# Patient Record
Sex: Male | Born: 1955 | Race: White | Hispanic: No | Smoking: Former smoker
Health system: Southern US, Community
[De-identification: ages and names within clinical notes are randomized; demographics above are authoritative.]

## PROBLEM LIST (undated history)

## (undated) DIAGNOSIS — E785 Hyperlipidemia, unspecified: Secondary | ICD-10-CM

## (undated) DIAGNOSIS — B019 Varicella without complication: Secondary | ICD-10-CM

## (undated) DIAGNOSIS — I10 Essential (primary) hypertension: Secondary | ICD-10-CM

## (undated) HISTORY — DX: Hyperlipidemia, unspecified: E78.5

## (undated) HISTORY — DX: Essential (primary) hypertension: I10

## (undated) HISTORY — DX: Varicella without complication: B01.9

---
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
## 2015-09-29 ENCOUNTER — Encounter: Payer: Self-pay | Admitting: Family Medicine

## 2015-09-29 ENCOUNTER — Ambulatory Visit (INDEPENDENT_AMBULATORY_CARE_PROVIDER_SITE_OTHER): Payer: 59 | Admitting: Family Medicine

## 2015-09-29 VITALS — BP 144/82 | HR 79 | Temp 98.2°F | Ht 67.5 in | Wt 177.8 lb

## 2015-09-29 DIAGNOSIS — Z125 Encounter for screening for malignant neoplasm of prostate: Secondary | ICD-10-CM | POA: Diagnosis not present

## 2015-09-29 DIAGNOSIS — E785 Hyperlipidemia, unspecified: Secondary | ICD-10-CM | POA: Diagnosis not present

## 2015-09-29 DIAGNOSIS — R3989 Other symptoms and signs involving the genitourinary system: Secondary | ICD-10-CM | POA: Insufficient documentation

## 2015-09-29 DIAGNOSIS — I10 Essential (primary) hypertension: Secondary | ICD-10-CM | POA: Insufficient documentation

## 2015-09-29 NOTE — Patient Instructions (Signed)
Nice to meet you. We will have you return for lab work. Please monitor your urine dripping and if it worsens please let us know.

## 2015-09-29 NOTE — Assessment & Plan Note (Signed)
Intermittently takes simvastatin. We will have the patient return for fasting lipid panel.

## 2015-09-29 NOTE — Progress Notes (Signed)
Patient ID: William Yang, male   DOB: 04/01/1956, 60 y.o.   MRN: [id number]  Tommi Rumps, MD Phone: [phone]  William Yang is a 60 y.o. male who presents today for new patient visit.  HYPERTENSION Disease Monitoring Home BP Monitoring no Chest pain- no    Dyspnea- no Medications Compliance-  taking HCTZ 25 mg daily. Lightheadedness-  no  Edema- no  HYPERLIPIDEMIA Symptoms Chest pain on exertion:  No   Leg claudication:   No Medications: Compliance- intermittently takes statin Right upper quadrant pain- no  Muscle aches- no  Notes that infrequently he leaks a small amount of urine after urinating. He notes he shakes his penis and tries to drain his bladder though infrequently he will have a small amount of urine that still comes out. Does not occur every time he urinates. Does not leak urine at other times. No urgency, frequency, dysuria, or flow issues. Notes he urinates 7-8 times per day. None at night. PSAs a been normal in the past. Typically only occurs when he is rushing to go to the bathroom. Has been on Flomax before for this. Is not helpful.    Active Ambulatory Problems    Diagnosis Date Noted  . Essential hypertension 09/29/2015  . Hyperlipidemia 09/29/2015  . Urine troubles 09/29/2015   Resolved Ambulatory Problems    Diagnosis Date Noted  . No Resolved Ambulatory Problems   Past Medical History  Diagnosis Date  . Chickenpox   . Hypertension     Family History  Problem Relation Age of Onset  . Alcoholism      Parent  . Lung cancer Brother   . Hypertension      Parent    Social History   Social History  . Marital Status: Married    Spouse Name: N/A  . Number of Children: N/A  . Years of Education: N/A   Occupational History  . Not on file.   Social History Main Topics  . Smoking status: Current Every Day Smoker  . Smokeless tobacco: Not on file  . Alcohol Use: 12.6 oz/week    21 Standard drinks or equivalent per week     Comment: 21 beers a  week   . Drug Use: No  . Sexual Activity: Not on file   Other Topics Concern  . Not on file   Social History Narrative  . No narrative on file    ROS   General:  Negative for nexplained weight loss, fever Skin: Negative for new or changing mole, sore that won't heal HEENT: Negative for trouble hearing, trouble seeing, ringing in ears, mouth sores, hoarseness, change in voice, dysphagia. CV:  Negative for chest pain, dyspnea, edema, palpitations Resp: Positive for cough (recent URI that is resolving), Negative for dyspnea, hemoptysis GI: Negative for nausea, vomiting, diarrhea, constipation, abdominal pain, melena, hematochezia. GU: Negative for dysuria, incontinence, urinary hesitance, hematuria, vaginal or penile discharge, polyuria, sexual difficulty, lumps in testicle or breasts MSK: Negative for muscle cramps or aches, joint pain or swelling Neuro: Negative for headaches, weakness, numbness, dizziness, passing out/fainting Psych: Negative for depression, anxiety, memory problems  Objective  Physical Exam Filed Vitals:   09/29/15 1527 09/29/15 2127  BP: 148/88 144/82  Pulse: 79   Temp: 98.2 F (36.8 C)     BP Readings from Last 3 Encounters:  09/29/15 144/82   Wt Readings from Last 3 Encounters:  09/29/15 177 lb 12.8 oz (80.65 kg)    Physical Exam  Constitutional: He is well-developed, well-nourished,  and in no distress.  HENT:  Head: Normocephalic and atraumatic.  Right Ear: External ear normal.  Left Ear: External ear normal.  Mouth/Throat: Oropharynx is clear and moist. No oropharyngeal exudate.  Eyes: Conjunctivae are normal. Pupils are equal, round, and reactive to light.  Neck: Neck supple.  Cardiovascular: Normal rate, regular rhythm and normal heart sounds.  Exam reveals no gallop and no friction rub.   No murmur heard. Pulmonary/Chest: Effort normal and breath sounds normal. No respiratory distress. He has no wheezes. He has no rales.  Abdominal:  Soft. Bowel sounds are normal. He exhibits no distension. There is no tenderness. There is no rebound and no guarding.  Genitourinary: Penis normal.  Normal testicles, normal scrotum, normal epididymis, normal vas deferens, no inguinal hernias  Musculoskeletal: He exhibits no edema.  Lymphadenopathy:    He has no cervical adenopathy.  Neurological: He is alert. Gait normal.  Skin: Skin is warm and dry. He is not diaphoretic.  Psychiatric: Mood and affect normal.     Assessment/Plan:   Essential hypertension Not at goal. Defined patient's goal as less than 140/90. Advised addition of medication and/or diet and exercise, though patient declined both of these options stating that this is what his blood pressure has been for a long time and he would prefer to continue to monitor. I additionally discussed the risks of having undertreated high blood pressure. We'll check lab work.  Hyperlipidemia Intermittently takes simvastatin. We will have the patient return for fasting lipid panel.  Urine troubles Patient leaks very small amounts of urine infrequently after urination only. Benign exam of genitals. Patient refused rectal exam for prostate exam. Given infrequency the was advised to try to completely empty his bladder and take his time when he goes to the bathroom to see if this helps. If worsens he will let us know. Given return precautions.    Orders Placed This Encounter  Procedures  . Comp Met (CMET)    Standing Status: Future     Number of Occurrences:      Standing Expiration Date: 09/28/2016  . Lipid Profile    Standing Status: Future     Number of Occurrences:      Standing Expiration Date: 09/28/2016  . PSA    Standing Status: Future     Number of Occurrences:      Standing Expiration Date: 09/28/2016  . HgB A1c    Standing Status: Future     Number of Occurrences:      Standing Expiration Date: 09/28/2016    Tommi Rumps

## 2015-09-29 NOTE — Assessment & Plan Note (Addendum)
Not at goal. Defined patient's goal as less than 140/90. Advised addition of medication and/or diet and exercise, though patient declined both of these options stating that this is what his blood pressure has been for a long time and he would prefer to continue to monitor. I additionally discussed the risks of having undertreated high blood pressure. We'll check lab work.

## 2015-09-29 NOTE — Progress Notes (Signed)
Pre visit review using our clinic review tool, if applicable. No additional management support is needed unless otherwise documented below in the visit note. 

## 2015-09-29 NOTE — Assessment & Plan Note (Signed)
Patient leaks very small amounts of urine infrequently after urination only. Benign exam of genitals. Patient refused rectal exam for prostate exam. Given infrequency the was advised to try to completely empty his bladder and take his time when he goes to the bathroom to see if this helps. If worsens he will let us know. Given return precautions.

## 2015-10-31 ENCOUNTER — Ambulatory Visit: Payer: 59 | Admitting: Family Medicine

## 2015-10-31 ENCOUNTER — Other Ambulatory Visit (INDEPENDENT_AMBULATORY_CARE_PROVIDER_SITE_OTHER): Payer: Managed Care, Other (non HMO)

## 2015-10-31 DIAGNOSIS — Z125 Encounter for screening for malignant neoplasm of prostate: Secondary | ICD-10-CM

## 2015-10-31 DIAGNOSIS — I10 Essential (primary) hypertension: Secondary | ICD-10-CM

## 2015-10-31 DIAGNOSIS — E785 Hyperlipidemia, unspecified: Secondary | ICD-10-CM | POA: Diagnosis not present

## 2015-10-31 LAB — COMPREHENSIVE METABOLIC PANEL
ALBUMIN: 4.3 g/dL (ref 3.5–5.2)
ALK PHOS: 63 U/L (ref 39–117)
ALT: 15 U/L (ref 0–53)
AST: 14 U/L (ref 0–37)
BILIRUBIN TOTAL: 0.7 mg/dL (ref 0.2–1.2)
BUN: 13 mg/dL (ref 6–23)
CALCIUM: 9.1 mg/dL (ref 8.4–10.5)
CO2: 26 mEq/L (ref 19–32)
Chloride: 103 mEq/L (ref 96–112)
Creatinine, Ser: 0.84 mg/dL (ref 0.40–1.50)
GFR: 98.89 mL/min (ref >60.00)
Glucose, Bld: 104 mg/dL — ABNORMAL HIGH (ref 70–99)
POTASSIUM: 3.9 meq/L (ref 3.5–5.1)
Sodium: 138 mEq/L (ref 135–145)
TOTAL PROTEIN: 6.4 g/dL (ref 6.0–8.3)

## 2015-10-31 LAB — LIPID PANEL
CHOLESTEROL: 173 mg/dL (ref 0–200)
HDL: 48.4 mg/dL (ref >39.00)
LDL Cholesterol: 107 mg/dL — ABNORMAL HIGH (ref 0–99)
NONHDL: 124.5
TRIGLYCERIDES: 86 mg/dL (ref 0.0–149.0)
Total CHOL/HDL Ratio: 4
VLDL: 17.2 mg/dL (ref 0.0–40.0)

## 2015-10-31 LAB — HEMOGLOBIN A1C: Hgb A1c MFr Bld: 6.1 % (ref 4.6–6.5)

## 2015-10-31 LAB — PSA: PSA: 2.55 ng/mL (ref 0.10–4.00)

## 2015-11-04 ENCOUNTER — Telehealth: Payer: Self-pay | Admitting: *Deleted

## 2015-11-04 ENCOUNTER — Encounter (INDEPENDENT_AMBULATORY_CARE_PROVIDER_SITE_OTHER): Payer: Self-pay

## 2015-11-04 ENCOUNTER — Other Ambulatory Visit: Payer: Self-pay | Admitting: Family Medicine

## 2015-11-04 MED ORDER — SIMVASTATIN 40 MG PO TABS
40.0000 mg | ORAL_TABLET | Freq: Every day | ORAL | Status: DC
Start: 2015-11-04 — End: 2016-01-31

## 2015-11-04 NOTE — Telephone Encounter (Signed)
Patient has requested a call in return to discuss the change in Simvastatin dose. Patients wife contact [phone]

## 2015-11-04 NOTE — Telephone Encounter (Signed)
I have spoke to the patients wife about message.

## 2015-11-04 NOTE — Telephone Encounter (Signed)
It is in the lab results that you sent him the message on this morning.

## 2015-11-04 NOTE — Telephone Encounter (Signed)
Roselyn Reef do you knw if Dr. Truett Mainland mentions anything to about his meds? Please advise, thanks

## 2015-11-07 ENCOUNTER — Telehealth: Payer: Self-pay | Admitting: Family Medicine

## 2015-11-07 NOTE — Telephone Encounter (Signed)
Please find out if the patient is a had any colon polyps. If he has not had colon polyps we can do the stool cards and he can come in to pick them up at his convenience. If he has had colon polyps we should have a discussion regarding the need for colonoscopy. If at that time he still wants to do stool cards we can do those.

## 2015-11-07 NOTE — Telephone Encounter (Signed)
Pt is overdue for Colonoscopy and wants to due the stool cards. Please advise, thanks

## 2015-11-07 NOTE — Telephone Encounter (Signed)
Pt is due for a coloscopy but wants to do the stool sample instead. Let me know when he can come in to do that? Call pt @ [phone]. Thank you!

## 2015-11-07 NOTE — Telephone Encounter (Signed)
Spoke with patients wife and she is going to pick up the stool cards for Mr Vannucci.

## 2015-11-07 NOTE — Telephone Encounter (Signed)
Pt states that he has never polyps. Please advise, thanks

## 2015-11-07 NOTE — Telephone Encounter (Signed)
Please place stool cards at from for patient to pick up. Please call patient to inform them he can come pick them up and complete them. Thanks.

## 2015-11-17 ENCOUNTER — Ambulatory Visit (INDEPENDENT_AMBULATORY_CARE_PROVIDER_SITE_OTHER): Payer: Managed Care, Other (non HMO)

## 2015-11-17 ENCOUNTER — Telehealth: Payer: Self-pay | Admitting: *Deleted

## 2015-11-17 ENCOUNTER — Other Ambulatory Visit (INDEPENDENT_AMBULATORY_CARE_PROVIDER_SITE_OTHER): Payer: Managed Care, Other (non HMO)

## 2015-11-17 DIAGNOSIS — Z114 Encounter for screening for human immunodeficiency virus [HIV]: Secondary | ICD-10-CM

## 2015-11-17 DIAGNOSIS — Z418 Encounter for other procedures for purposes other than remedying health state: Secondary | ICD-10-CM | POA: Diagnosis not present

## 2015-11-17 DIAGNOSIS — Z1159 Encounter for screening for other viral diseases: Secondary | ICD-10-CM

## 2015-11-17 DIAGNOSIS — Z23 Encounter for immunization: Secondary | ICD-10-CM

## 2015-11-17 DIAGNOSIS — Z299 Encounter for prophylactic measures, unspecified: Secondary | ICD-10-CM

## 2015-11-17 NOTE — Telephone Encounter (Signed)
Labs and dx?  

## 2015-11-17 NOTE — Telephone Encounter (Signed)
I do not recall advising him to come in for lab work.

## 2015-11-17 NOTE — Telephone Encounter (Signed)
What would you like me to do?

## 2015-11-17 NOTE — Progress Notes (Signed)
Dr.Sonnenberg gave verbal okay to give patient TDaP and zoster vaccine if not given in the last ten years.  Patient tolerated well.

## 2015-11-18 ENCOUNTER — Other Ambulatory Visit: Payer: Self-pay | Admitting: *Deleted

## 2015-11-18 ENCOUNTER — Other Ambulatory Visit: Payer: Managed Care, Other (non HMO)

## 2015-11-18 DIAGNOSIS — Z1159 Encounter for screening for other viral diseases: Secondary | ICD-10-CM

## 2015-11-18 DIAGNOSIS — Z114 Encounter for screening for human immunodeficiency virus [HIV]: Secondary | ICD-10-CM

## 2015-11-18 NOTE — Telephone Encounter (Signed)
Spoke with patient about doing HIV and Hep C screening. He is fine with doing both screenings. I placed orders for both test

## 2015-11-18 NOTE — Telephone Encounter (Signed)
Noted  

## 2015-11-18 NOTE — Telephone Encounter (Signed)
Do i throw samples away?

## 2015-11-19 LAB — HIV ANTIBODY (ROUTINE TESTING W REFLEX): HIV: NONREACTIVE

## 2015-11-19 LAB — HEPATITIS C ANTIBODY: HCV AB: NEGATIVE

## 2015-11-21 ENCOUNTER — Other Ambulatory Visit: Payer: Managed Care, Other (non HMO)

## 2015-11-22 ENCOUNTER — Other Ambulatory Visit: Payer: Managed Care, Other (non HMO)

## 2015-11-22 ENCOUNTER — Telehealth: Payer: Self-pay | Admitting: Family Medicine

## 2015-11-22 NOTE — Telephone Encounter (Signed)
Left message returning William Yang's call.

## 2015-11-24 ENCOUNTER — Other Ambulatory Visit (INDEPENDENT_AMBULATORY_CARE_PROVIDER_SITE_OTHER): Payer: Managed Care, Other (non HMO)

## 2015-11-24 DIAGNOSIS — Z1211 Encounter for screening for malignant neoplasm of colon: Secondary | ICD-10-CM | POA: Diagnosis not present

## 2015-11-25 ENCOUNTER — Other Ambulatory Visit: Payer: Self-pay | Admitting: *Deleted

## 2015-11-25 DIAGNOSIS — Z1211 Encounter for screening for malignant neoplasm of colon: Secondary | ICD-10-CM

## 2015-11-25 LAB — FECAL OCCULT BLOOD, IMMUNOCHEMICAL: Fecal Occult Bld: NEGATIVE

## 2015-11-26 ENCOUNTER — Encounter: Payer: Self-pay | Admitting: Family Medicine

## 2016-01-05 ENCOUNTER — Other Ambulatory Visit: Payer: Self-pay | Admitting: *Deleted

## 2016-01-05 NOTE — Telephone Encounter (Signed)
Please advise refill, this was a historical medication for you. thanks

## 2016-01-05 NOTE — Telephone Encounter (Signed)
Medication refill requested for hydrochlothiazide [facility]

## 2016-01-06 ENCOUNTER — Other Ambulatory Visit: Payer: Self-pay | Admitting: *Deleted

## 2016-01-06 MED ORDER — HYDROCHLOROTHIAZIDE 25 MG PO TABS: 25.0000 mg | ORAL_TABLET | Freq: Every day | ORAL | Status: DC

## 2016-01-06 NOTE — Telephone Encounter (Signed)
Okay to refill? 

## 2016-01-31 ENCOUNTER — Other Ambulatory Visit: Payer: Self-pay | Admitting: Family Medicine

## 2016-02-07 ENCOUNTER — Other Ambulatory Visit: Payer: Self-pay | Admitting: Family Medicine

## 2016-05-02 ENCOUNTER — Telehealth: Payer: Self-pay | Admitting: Family Medicine

## 2016-05-02 NOTE — Telephone Encounter (Signed)
Forms in red folder

## 2016-05-02 NOTE — Telephone Encounter (Signed)
Pt dropped off FMLA paperwork to be filled out.. Placed in Dr. Caryl Bis folder up front.Marland Kitchen

## 2016-05-05 DIAGNOSIS — Z7689 Persons encountering health services in other specified circumstances: Secondary | ICD-10-CM

## 2016-05-05 NOTE — Telephone Encounter (Signed)
Forms filled out for FMLA based on wifes illness. Placed on [employer].

## 2016-05-07 ENCOUNTER — Ambulatory Visit (INDEPENDENT_AMBULATORY_CARE_PROVIDER_SITE_OTHER): Payer: Managed Care, Other (non HMO) | Admitting: Family Medicine

## 2016-05-07 VITALS — BP 120/82 | HR 63 | Wt 185.0 lb

## 2016-05-07 DIAGNOSIS — E785 Hyperlipidemia, unspecified: Secondary | ICD-10-CM | POA: Diagnosis not present

## 2016-05-07 DIAGNOSIS — M25562 Pain in left knee: Secondary | ICD-10-CM

## 2016-05-07 NOTE — Assessment & Plan Note (Signed)
Continue Zocor. Check direct LDL and CMP.

## 2016-05-07 NOTE — Telephone Encounter (Signed)
Spoke with patients wife and forms put up front to pick up.

## 2016-05-07 NOTE — Assessment & Plan Note (Signed)
Located in the area of the hamstring tendon and its insertion area. Suspect strain or tendinitis. Has been improving. Encouraged icing and stretching. Ibuprofen as needed. If not improving would refer to sports medicine for ultrasound.

## 2016-05-07 NOTE — Patient Instructions (Signed)
Nice to see you. We're going to check some lab work and call you with the results. You should ice the area of discomfort in your leg as this is likely a strain or tendinitis. You can also stretch the area by stretching your hamstrings. If this does not continue to improve please let us know.

## 2016-05-07 NOTE — Progress Notes (Signed)
  Tommi Rumps, MD Phone: [phone]  William Yang is a 60 y.o. male who presents today for same-day visit.  Left knee pain: Patient notes this has been going on for several months. Notes it is the posterior lateral aspect of his knee where his hamstring tendon inserts. Did try wearing a brace which was beneficial. Notes it is just slightly sore in this area at times. No popping, locking, or giving out. Notes over the last several days it has increasingly gotten better. No specific injury that he can think of. Started after a particularly long day hauling wood.  Hyperlipidemia: Intermittently taking Zocor. No right upper quadrant pain, chest pain, or shortness of breath.   ROS see history of present illness  Objective  Physical Exam Vitals:   05/07/16 1548  BP: 120/82  Pulse: 63    BP Readings from Last 3 Encounters:  05/07/16 120/82  09/29/15 (!) 144/82   Wt Readings from Last 3 Encounters:  05/07/16 185 lb (83.9 kg)  09/29/15 177 lb 12.8 oz (80.6 kg)    Physical Exam  Constitutional: No distress.  Cardiovascular: Normal rate, regular rhythm and normal heart sounds.   Pulmonary/Chest: Effort normal and breath sounds normal.  Musculoskeletal:  Bilateral knees with no joint line tenderness, soft tissue tenderness, or bony tenderness, no warmth or erythema, no tenderness of the hamstrings or gastrocs, no ligaments laxity, negative McMurray's  Neurological: He is alert. Gait normal.  Skin: Skin is warm and dry. He is not diaphoretic.     Assessment/Plan: Please see individual problem list.  Hyperlipidemia Continue Zocor. Check direct LDL and CMP.  Posterior left knee pain Located in the area of the hamstring tendon and its insertion area. Suspect strain or tendinitis. Has been improving. Encouraged icing and stretching. Ibuprofen as needed. If not improving would refer to sports medicine for ultrasound.   Orders Placed This Encounter  Procedures  . Comp Met (CMET)    . Direct LDL    Tommi Rumps, MD [place]

## 2016-05-08 LAB — COMPREHENSIVE METABOLIC PANEL
ALK PHOS: 55 U/L (ref 39–117)
ALT: 22 U/L (ref 0–53)
AST: 16 U/L (ref 0–37)
Albumin: 4.1 g/dL (ref 3.5–5.2)
BILIRUBIN TOTAL: 0.3 mg/dL (ref 0.2–1.2)
BUN: 18 mg/dL (ref 6–23)
CALCIUM: 8.9 mg/dL (ref 8.4–10.5)
CO2: 29 mEq/L (ref 19–32)
CREATININE: 1.33 mg/dL (ref 0.40–1.50)
Chloride: 104 mEq/L (ref 96–112)
GFR: 58.09 mL/min — AB (ref >60.00)
GLUCOSE: 88 mg/dL (ref 70–99)
Potassium: 3.8 mEq/L (ref 3.5–5.1)
Sodium: 139 mEq/L (ref 135–145)
TOTAL PROTEIN: 6.8 g/dL (ref 6.0–8.3)

## 2016-05-08 LAB — LDL CHOLESTEROL, DIRECT: Direct LDL: 114 mg/dL

## 2016-06-26 ENCOUNTER — Other Ambulatory Visit: Payer: Self-pay

## 2016-06-26 MED ORDER — HYDROCHLOROTHIAZIDE 25 MG PO TABS: 25.0000 mg | ORAL_TABLET | Freq: Every day | ORAL | 3 refills | Status: DC

## 2016-06-26 NOTE — Telephone Encounter (Signed)
lov 918/17 Nov sched

## 2016-09-04 ENCOUNTER — Encounter: Payer: Self-pay | Admitting: Family Medicine

## 2016-09-04 ENCOUNTER — Ambulatory Visit (INDEPENDENT_AMBULATORY_CARE_PROVIDER_SITE_OTHER): Payer: Managed Care, Other (non HMO) | Admitting: Family Medicine

## 2016-09-04 VITALS — BP 150/90 | HR 76 | Temp 98.1°F | Wt 197.8 lb

## 2016-09-04 DIAGNOSIS — I10 Essential (primary) hypertension: Secondary | ICD-10-CM

## 2016-09-04 DIAGNOSIS — L0291 Cutaneous abscess, unspecified: Secondary | ICD-10-CM

## 2016-09-04 DIAGNOSIS — F419 Anxiety disorder, unspecified: Secondary | ICD-10-CM | POA: Diagnosis not present

## 2016-09-04 LAB — COMPREHENSIVE METABOLIC PANEL
ALK PHOS: 64 U/L (ref 39–117)
ALT: 25 U/L (ref 0–53)
AST: 19 U/L (ref 0–37)
Albumin: 4.2 g/dL (ref 3.5–5.2)
BUN: 10 mg/dL (ref 6–23)
CHLORIDE: 103 meq/L (ref 96–112)
CO2: 28 mEq/L (ref 19–32)
Calcium: 10 mg/dL (ref 8.4–10.5)
Creatinine, Ser: 0.89 mg/dL (ref 0.40–1.50)
GFR: 92.25 mL/min (ref >60.00)
GLUCOSE: 114 mg/dL — AB (ref 70–99)
POTASSIUM: 3.6 meq/L (ref 3.5–5.1)
SODIUM: 139 meq/L (ref 135–145)
TOTAL PROTEIN: 7.1 g/dL (ref 6.0–8.3)
Total Bilirubin: 0.3 mg/dL (ref 0.2–1.2)

## 2016-09-04 MED ORDER — DOXYCYCLINE HYCLATE 100 MG PO TABS: 100.0000 mg | ORAL_TABLET | Freq: Two times a day (BID) | ORAL | 0 refills | Status: DC

## 2016-09-04 MED ORDER — BUSPIRONE HCL 7.5 MG PO TABS: 7.5000 mg | ORAL_TABLET | Freq: Two times a day (BID) | ORAL | 1 refills | Status: DC

## 2016-09-04 NOTE — Assessment & Plan Note (Addendum)
Elevated today. Potentially related to pain and stress. Improved somewhat on recheck. Discussed options of starting an additional medication versus rechecking in several days when we see him back for his abscess. He opted to recheck in several days. I asked that he try to check it at home as well if possible. Check CMP today.

## 2016-09-04 NOTE — Progress Notes (Signed)
Pre visit review using our clinic review tool, if applicable. No additional management support is needed unless otherwise documented below in the visit note. 

## 2016-09-04 NOTE — Assessment & Plan Note (Signed)
Area is consistent with an abscess. It is open and draining some. No area of fluctuance. Potentially could be an inflamed cyst given that he has recurrence of this in the same area over the years. Discussed warm compresses. Treat with doxycycline. Follow-up in 2-3 days for recheck. Once resolved could consider referral to general surgery or dermatology for consideration of removal of cyst if it appears there is one present. Given return precautions.

## 2016-09-04 NOTE — Patient Instructions (Signed)
Nice to see you. We'll start you on doxycycline for your boil.  We will start you on BuSpar for your anxiety. We'll check some lab work today and contact you with the results. If he develops spreading redness, worsening pain, fevers, chest pain, shortness of breath, or any new or changing symptoms please seek medical attention immediately.

## 2016-09-04 NOTE — Assessment & Plan Note (Signed)
Anxiety related to his wife's current medical conditions and her going on hospice. We will start on BuSpar for this. He'll continue to monitor. If worsens to let us know.

## 2016-09-04 NOTE — Progress Notes (Signed)
  Tommi Rumps, MD Phone: [phone]  William Yang is a 61 y.o. male who presents today for same-day visit.  Patient notes onset of boil in right lower gluteal cleft 2-3 days ago. Is warm to the touch and tender. Patient notes it opened up yesterday and drained bloody fluid. Notes no fevers. Has had this in the past with recurrence in the exact same spot over the years.  Patient notes he is under a lot of stress and has a lot of anxiety with regards to his wife who has idiopathic pulmonary fibrosis and is about to go under hospice. He notes no depression with this. He wonders if there is a medication that can help him with this.  Hypertension: Not checking at home. Is taking HCTZ. No chest pain, shortness breath, or edema.  PMH: Former smoker   ROS see history of present illness  Objective  Physical Exam Vitals:   09/04/16 1112 09/04/16 1133  BP: (!) 180/98 (!) 150/90  Pulse: 76   Temp: 98.1 F (36.7 C)     BP Readings from Last 3 Encounters:  09/04/16 (!) 150/90  05/07/16 120/82  09/29/15 (!) 144/82   Wt Readings from Last 3 Encounters:  09/04/16 197 lb 12.8 oz (89.7 kg)  05/07/16 185 lb (83.9 kg)  09/29/15 177 lb 12.8 oz (80.6 kg)    Physical Exam  Constitutional: No distress.  HENT:  Head: Normocephalic and atraumatic.  Cardiovascular: Normal rate, regular rhythm and normal heart sounds.   Pulmonary/Chest: Effort normal and breath sounds normal.  Musculoskeletal: He exhibits no edema.  Neurological: He is alert. Gait normal.  Skin: Skin is warm and dry. He is not diaphoretic.  Right lower gluteal cleft with quarter-sized area of induration with central drainage, no area of fluctuance, the area is erythematous, no surrounding erythema, area is tender     Assessment/Plan: Please see individual problem list.  Abscess Area is consistent with an abscess. It is open and draining some. No area of fluctuance. Potentially could be an inflamed cyst given that he has  recurrence of this in the same area over the years. Discussed warm compresses. Treat with doxycycline. Follow-up in 2-3 days for recheck. Once resolved could consider referral to general surgery or dermatology for consideration of removal of cyst if it appears there is one present. Given return precautions.  Anxiety Anxiety related to his wife's current medical conditions and her going on hospice. We will start on BuSpar for this. He'll continue to monitor. If worsens to let us know.  Essential hypertension Elevated today. Potentially related to pain and stress. Improved somewhat on recheck. Discussed options of starting an additional medication versus rechecking in several days when we see him back for his abscess. He opted to recheck in several days. I asked that he try to check it at home as well if possible. Check CMP today.   Orders Placed This Encounter  Procedures  . Comp Met (CMET)    Meds ordered this encounter  Medications  . doxycycline (VIBRA-TABS) 100 MG tablet    Sig: Take 1 tablet (100 mg total) by mouth 2 (two) times daily.    Dispense:  14 tablet    Refill:  0  . busPIRone (BUSPAR) 7.5 MG tablet    Sig: Take 1 tablet (7.5 mg total) by mouth 2 (two) times daily.    Dispense:  60 tablet    Refill:  George, MD [place]

## 2016-09-07 ENCOUNTER — Ambulatory Visit (INDEPENDENT_AMBULATORY_CARE_PROVIDER_SITE_OTHER): Payer: Managed Care, Other (non HMO) | Admitting: Family Medicine

## 2016-09-07 DIAGNOSIS — I10 Essential (primary) hypertension: Secondary | ICD-10-CM | POA: Diagnosis not present

## 2016-09-07 DIAGNOSIS — L0291 Cutaneous abscess, unspecified: Secondary | ICD-10-CM

## 2016-09-07 NOTE — Assessment & Plan Note (Signed)
Improved from previously though still not at goal. Patient would like to hold off on adding any additional medications at this time. Discussed monitoring his blood pressure at home or at the pharmacy. Follow-up in one month for recheck.

## 2016-09-07 NOTE — Progress Notes (Signed)
Pre visit review using our clinic review tool, if applicable. No additional management support is needed unless otherwise documented below in the visit note. 

## 2016-09-07 NOTE — Assessment & Plan Note (Addendum)
Much improved. He will finish the course of doxycycline and continue to monitor. Given return precautions in AVS.

## 2016-09-07 NOTE — Patient Instructions (Signed)
Nice to see you. Please complete the doxycycline. Please keep eye on your blood pressure at home. Your goal is less than 140/90. If you develop spreading redness, fever, pain, chest pain, shortness breath, or any new or changing symptoms please seek medical attention medially.

## 2016-09-07 NOTE — Progress Notes (Signed)
  Tommi Rumps, MD Phone: [phone]  William Yang is a 61 y.o. male who presents today for follow-up.  Abscess: Notes it is quite a bit better. He is able to sit with no pain. No more drainage. Fevers. There is minimal little warmth to it though it is improved. He is taking doxycycline.  Hypertension: Improved today. No chest pain or shortness of breath or lightheadedness. Is taking HCTZ. He wants to keep an eye on this moving forward and recheck in about a month given the stress he is under currently with his wife being in the ICU.  PMH: Former smoker   ROS see history of present illness  Objective  Physical Exam Vitals:   09/07/16 1011  BP: (!) 142/90  Pulse: 72  Temp: 97.9 F (36.6 C)    BP Readings from Last 3 Encounters:  09/07/16 (!) 142/90  09/04/16 (!) 150/90  05/07/16 120/82   Wt Readings from Last 3 Encounters:  09/07/16 193 lb 10.1 oz (87.8 kg)  09/04/16 197 lb 12.8 oz (89.7 kg)  05/07/16 185 lb (83.9 kg)    Physical Exam  Constitutional: No distress.  Cardiovascular: Normal rate, regular rhythm and normal heart sounds.   Pulmonary/Chest: Effort normal and breath sounds normal.  Skin: He is not diaphoretic.  Right lower gluteal cleft area with nickel-sized area of mild erythema with no induration or fluctuance, nontender, no warmth     Assessment/Plan: Please see individual problem list.  Essential hypertension Improved from previously though still not at goal. Patient would like to hold off on adding any additional medications at this time. Discussed monitoring his blood pressure at home or at the pharmacy. Follow-up in one month for recheck.  Abscess Much improved. He will finish the course of doxycycline and continue to monitor. Given return precautions in AVS.   Tommi Rumps, MD [place]

## 2016-10-08 ENCOUNTER — Ambulatory Visit: Payer: Managed Care, Other (non HMO) | Admitting: Family Medicine

## 2016-10-20 ENCOUNTER — Other Ambulatory Visit: Payer: Self-pay | Admitting: Family Medicine

## 2016-10-30 ENCOUNTER — Other Ambulatory Visit: Payer: Self-pay | Admitting: Family Medicine

## 2017-01-16 ENCOUNTER — Other Ambulatory Visit: Payer: Self-pay | Admitting: Family Medicine

## 2017-02-19 ENCOUNTER — Encounter: Payer: Self-pay | Admitting: Family Medicine

## 2017-02-19 ENCOUNTER — Ambulatory Visit (INDEPENDENT_AMBULATORY_CARE_PROVIDER_SITE_OTHER): Payer: Managed Care, Other (non HMO) | Admitting: Family Medicine

## 2017-02-19 VITALS — BP 160/100 | HR 71 | Temp 99.1°F | Wt 188.6 lb

## 2017-02-19 DIAGNOSIS — I10 Essential (primary) hypertension: Secondary | ICD-10-CM | POA: Diagnosis not present

## 2017-02-19 DIAGNOSIS — E785 Hyperlipidemia, unspecified: Secondary | ICD-10-CM | POA: Diagnosis not present

## 2017-02-19 DIAGNOSIS — M722 Plantar fascial fibromatosis: Secondary | ICD-10-CM

## 2017-02-19 DIAGNOSIS — T148XXA Other injury of unspecified body region, initial encounter: Secondary | ICD-10-CM

## 2017-02-19 DIAGNOSIS — F419 Anxiety disorder, unspecified: Secondary | ICD-10-CM | POA: Diagnosis not present

## 2017-02-19 LAB — BASIC METABOLIC PANEL
BUN: 15 mg/dL (ref 7–25)
CO2: 24 mmol/L (ref 20–31)
Calcium: 9 mg/dL (ref 8.6–10.3)
Chloride: 104 mmol/L (ref 98–110)
Creat: 1.15 mg/dL (ref 0.70–1.25)
Glucose, Bld: 103 mg/dL — ABNORMAL HIGH (ref 65–99)
POTASSIUM: 3.6 mmol/L (ref 3.5–5.3)
SODIUM: 140 mmol/L (ref 135–146)

## 2017-02-19 MED ORDER — LISINOPRIL 10 MG PO TABS: 5.0000 mg | ORAL_TABLET | Freq: Every day | ORAL | 3 refills | Status: DC

## 2017-02-19 MED ORDER — SERTRALINE HCL 50 MG PO TABS: 50.0000 mg | ORAL_TABLET | Freq: Every day | ORAL | 3 refills | Status: DC

## 2017-02-19 NOTE — Assessment & Plan Note (Signed)
In left index finger. Unable to remove today with tweezers. Discussed cleansing and scrubbing the area to try to get it to come out. He'll monitor for any signs of infection and if they occur he'll be reevaluated.

## 2017-02-19 NOTE — Assessment & Plan Note (Signed)
Continue simvastatin. 

## 2017-02-19 NOTE — Assessment & Plan Note (Signed)
Continues to have issues. Did not continue BuSpar. Has been taking Xanax intermittently. Discussed this is not a great long-term medication. We'll start him on Zoloft. He'll follow-up in a month.

## 2017-02-19 NOTE — Patient Instructions (Addendum)
Nice to see you. We'll start you on Zoloft for anxiety. We are going to add lisinopril to your blood pressure regimen. Please ice your feet as we discussed. You can wash and scrub the left index finger is discussed. If you start to develop swelling, redness, or drainage please seek medical attention.

## 2017-02-19 NOTE — Assessment & Plan Note (Signed)
Above goal. We will add lisinopril. We'll check a BMP today. We'll check a BMP in 1 week. He'll follow-up with me in a month for recheck of his blood pressure.

## 2017-02-19 NOTE — Assessment & Plan Note (Signed)
Symptoms most consistent with plantar fasciitis. Discussed icing and heel cups. If not improving consider sports medicine referral.

## 2017-02-19 NOTE — Progress Notes (Signed)
Tommi Rumps, MD Phone: [phone]  William Yang is a 61 y.o. male who presents today for f/u.  HYPERTENSION  Disease Monitoring  Home BP Monitoring not checking Chest pain- no    Dyspnea- no Medications  Compliance-  Taking HCTZ.   Edema- no  Hyperlipidemia: Taking simvastatin. No right upper quadrant pain or myalgias.  Patient notes bilateral foot pain. Typically bothers him after he has been laying down over night or after he has been sitting for some period of time. The first steps hurt the worst. He notes it's in the area of the plantar fascia. Slightly better with new shoes.  Anxiety: Has intermittent anxiety related to missing his wife who passed away and also regarding his sister and older brother her recently diagnosed with cancer. He is taking Xanax couple times a week. No depression.  Patient works in Geophysicist/field seismologist and notes he occasionally gets little splinters in his fingers. Notes his left index finger appears to have a small little splinter in it. Notes he has been unable to get it out. Does not bother him unless he pushes on it. No erythema or drainage.   PMH: Former smoker   ROS see history of present illness  Objective  Physical Exam Vitals:   02/19/17 1548  BP: (!) 160/100  Pulse: 71  Temp: 99.1 F (37.3 C)    BP Readings from Last 3 Encounters:  02/19/17 (!) 160/100  09/07/16 (!) 142/90  09/04/16 (!) 150/90   Wt Readings from Last 3 Encounters:  02/19/17 188 lb 9.6 oz (85.5 kg)  09/07/16 193 lb 10.1 oz (87.8 kg)  09/04/16 197 lb 12.8 oz (89.7 kg)    Physical Exam  Constitutional: No distress.  Cardiovascular: Normal rate, regular rhythm and normal heart sounds.   Pulmonary/Chest: Effort normal and breath sounds normal.  Musculoskeletal: He exhibits no edema.  Possible small splinter in left tip of index finger, no surrounding erythema or drainage, minimal discomfort on palpation, attempted removal with tweezers though unable to remove it,  bilateral feet with tenderness over proximal plantar fascia insertion  Neurological: He is alert. Gait normal.  Skin: Skin is warm and dry. He is not diaphoretic.     Assessment/Plan: Please see individual problem list.  Essential hypertension Above goal. We will add lisinopril. We'll check a BMP today. We'll check a BMP in 1 week. He'll follow-up with me in a month for recheck of his blood pressure.  Hyperlipidemia Continue simvastatin.  Anxiety Continues to have issues. Did not continue BuSpar. Has been taking Xanax intermittently. Discussed this is not a great long-term medication. We'll start him on Zoloft. He'll follow-up in a month.  Splinter In left index finger. Unable to remove today with tweezers. Discussed cleansing and scrubbing the area to try to get it to come out. He'll monitor for any signs of infection and if they occur he'll be reevaluated.  Plantar fasciitis, bilateral Symptoms most consistent with plantar fasciitis. Discussed icing and heel cups. If not improving consider sports medicine referral.   Orders Placed This Encounter  Procedures  . Basic Metabolic Panel (BMET)    Meds ordered this encounter  Medications  . sertraline (ZOLOFT) 50 MG tablet    Sig: Take 1 tablet (50 mg total) by mouth daily.    Dispense:  30 tablet    Refill:  3  . lisinopril (PRINIVIL,ZESTRIL) 10 MG tablet    Sig: Take 0.5 tablets (5 mg total) by mouth daily.    Dispense:  45 tablet  Refill:  [place], MD [place]

## 2017-02-21 ENCOUNTER — Other Ambulatory Visit: Payer: Self-pay

## 2017-02-21 ENCOUNTER — Other Ambulatory Visit: Payer: Self-pay | Admitting: Family Medicine

## 2017-02-21 DIAGNOSIS — R7309 Other abnormal glucose: Secondary | ICD-10-CM

## 2017-02-21 MED ORDER — SIMVASTATIN 40 MG PO TABS: ORAL_TABLET | ORAL | 3 refills | Status: DC

## 2017-02-27 ENCOUNTER — Other Ambulatory Visit (INDEPENDENT_AMBULATORY_CARE_PROVIDER_SITE_OTHER): Payer: Managed Care, Other (non HMO)

## 2017-02-27 DIAGNOSIS — R7309 Other abnormal glucose: Secondary | ICD-10-CM

## 2017-02-27 DIAGNOSIS — I10 Essential (primary) hypertension: Secondary | ICD-10-CM | POA: Diagnosis not present

## 2017-02-28 ENCOUNTER — Telehealth: Payer: Self-pay | Admitting: Family Medicine

## 2017-02-28 LAB — BASIC METABOLIC PANEL
BUN: 19 mg/dL (ref 6–23)
CHLORIDE: 101 meq/L (ref 96–112)
CO2: 26 meq/L (ref 19–32)
Calcium: 9.3 mg/dL (ref 8.4–10.5)
Creatinine, Ser: 1.17 mg/dL (ref 0.40–1.50)
GFR: 67.17 mL/min (ref >60.00)
GLUCOSE: 111 mg/dL — AB (ref 70–99)
POTASSIUM: 4.1 meq/L (ref 3.5–5.1)
Sodium: 135 mEq/L (ref 135–145)

## 2017-02-28 LAB — HEMOGLOBIN A1C: Hgb A1c MFr Bld: 5.9 % (ref 4.6–6.5)

## 2017-02-28 NOTE — Telephone Encounter (Signed)
Pt called back returning your call. Please advise, thank you!  Call pt @ [phone]

## 2017-02-28 NOTE — Telephone Encounter (Signed)
Patient notified

## 2017-03-29 ENCOUNTER — Encounter: Payer: Self-pay | Admitting: Family Medicine

## 2017-03-29 ENCOUNTER — Ambulatory Visit (INDEPENDENT_AMBULATORY_CARE_PROVIDER_SITE_OTHER): Payer: Managed Care, Other (non HMO) | Admitting: Family Medicine

## 2017-03-29 DIAGNOSIS — D229 Melanocytic nevi, unspecified: Secondary | ICD-10-CM | POA: Insufficient documentation

## 2017-03-29 DIAGNOSIS — I10 Essential (primary) hypertension: Secondary | ICD-10-CM

## 2017-03-29 DIAGNOSIS — F1010 Alcohol abuse, uncomplicated: Secondary | ICD-10-CM

## 2017-03-29 DIAGNOSIS — F419 Anxiety disorder, unspecified: Secondary | ICD-10-CM

## 2017-03-29 NOTE — Assessment & Plan Note (Signed)
Discuss his current level of alcohol intake was excessive. Advised not to cold [place] quit due to risk of withdrawal. Discussed cutting back slowly versus seeing somebody for alcohol abuse treatment. Patient opted to cut back slowly. Discussed if he developed any new symptoms while cutting back he should be evaluated.

## 2017-03-29 NOTE — Patient Instructions (Signed)
Nice to see you. Please start monitoring your has blood pressure daily at home. Please call us in 2 weeks to let us know what this is been running. Your goal is less than 130/80. If it is not consistently less than this please let us know. Please try to decrease your alcohol intake. Please do not cut this out cold [place]. Please cut back slowly. If you would like to see somebody for this please let us know.

## 2017-03-29 NOTE — Assessment & Plan Note (Signed)
Improved from previously. Just above goal. Discussed having him check his blood pressure daily at home for the next 2 weeks and contacting us. If still elevated could consider increase in medication.

## 2017-03-29 NOTE — Assessment & Plan Note (Signed)
Patient notes this irritates him at times and itches. We'll refer to dermatology to consider removal.

## 2017-03-29 NOTE — Progress Notes (Signed)
  Tommi Rumps, MD Phone: [phone]  William Yang is a 62 y.o. male who presents today for f/u.  HYPERTENSION  Disease Monitoring  Home BP Monitoring not checking Chest pain- no    Dyspnea- no Medications  Compliance-  Taking HCTZ, lisinopril.  Edema- no  Anxiety: Patient notes this is significantly better. He is sleeping a lot better. He's been taking the Zoloft. Not taking any Xanax. No depression. No SI or HI.  Patient has a mole in his left clavicular area that has been there for most of his life. Notes it has gotten a little bit bigger. It does get irritated and itches. Has not changed shape.  Patient reports today that he's been drinking about 6 beers nightly for the last 6-7 months since his wife died. He drinks them over a number of hours. We discussed that this could be affecting his blood pressure.  PMH: Former smoker   ROS see history of present illness  Objective  Physical Exam Vitals:   03/29/17 1552  BP: (!) 130/92  Pulse: 74  Temp: 98.8 F (37.1 C)  SpO2: 97%    BP Readings from Last 3 Encounters:  03/29/17 (!) 130/92  02/19/17 (!) 160/100  09/07/16 (!) 142/90   Wt Readings from Last 3 Encounters:  03/29/17 181 lb 9.6 oz (82.4 kg)  02/19/17 188 lb 9.6 oz (85.5 kg)  09/07/16 193 lb 10.1 oz (87.8 kg)    Physical Exam  Constitutional: No distress.  Cardiovascular: Normal rate, regular rhythm and normal heart sounds.   Pulmonary/Chest: Effort normal and breath sounds normal.  Neurological: He is alert. Gait normal.  Skin: Skin is warm and dry. He is not diaphoretic.   benign appearing nevus over distal left clavicle   Assessment/Plan: Please see individual problem list.  Essential hypertension Improved from previously. Just above goal. Discussed having him check his blood pressure daily at home for the next 2 weeks and contacting us. If still elevated could consider increase in medication.  Anxiety Much improved. Continue Zoloft. Recheck in 2  months.  Nevus Patient notes this irritates him at times and itches. We'll refer to dermatology to consider removal.  Alcohol abuse Discuss his current level of alcohol intake was excessive. Advised not to cold [place] quit due to risk of withdrawal. Discussed cutting back slowly versus seeing somebody for alcohol abuse treatment. Patient opted to cut back slowly. Discussed if he developed any new symptoms while cutting back he should be evaluated.   Tommi Rumps, MD [place]

## 2017-03-29 NOTE — Assessment & Plan Note (Signed)
Much improved. Continue Zoloft. Recheck in 2 months.

## 2017-04-08 ENCOUNTER — Telehealth: Payer: Self-pay | Admitting: Family Medicine

## 2017-04-08 DIAGNOSIS — I10 Essential (primary) hypertension: Secondary | ICD-10-CM

## 2017-04-08 NOTE — Telephone Encounter (Signed)
Pt is asking for a refill on his lisinopril (PRINIVIL,ZESTRIL) 10 MG tablet. He is out, he did not know he was suppose to take half a tablet, he has been taking a whole tablet. Please advise, [phone].

## 2017-04-08 NOTE — Telephone Encounter (Signed)
Left message to return call 

## 2017-04-08 NOTE — Telephone Encounter (Signed)
Please call pt at  [phone]

## 2017-04-09 NOTE — Telephone Encounter (Signed)
Left message to return call 

## 2017-04-09 NOTE — Telephone Encounter (Signed)
Patient states he has been taking 1 tablet instead of 0.5. Patient states his blood pressure as been running about 150-170 and 80-90s 08/14 173/90 08/15 151/90 08/16 158/89 08/17 149/92 08/20 183/99 patient ran out of med saturday

## 2017-04-09 NOTE — Telephone Encounter (Signed)
Pt called back returning your call. Thank you! °

## 2017-04-10 MED ORDER — LISINOPRIL 10 MG PO TABS: 10.0000 mg | ORAL_TABLET | Freq: Every day | ORAL | 3 refills | Status: DC

## 2017-04-10 NOTE — Telephone Encounter (Signed)
Patient's blood pressure is uncontrolled. Please find out how long he's been taking lisinopril 10 mg daily for. We will either need to increase this or add another medication and possibly will need to do both. I sent in a refill of the lisinopril.

## 2017-04-10 NOTE — Telephone Encounter (Signed)
Patient stated that he's taken lisinopril as long as prescribed by Dr. Caryl Bis.  Pt contact [phone]

## 2017-04-10 NOTE — Telephone Encounter (Signed)
fyi

## 2017-04-10 NOTE — Telephone Encounter (Signed)
Left message to return call 

## 2017-04-11 NOTE — Telephone Encounter (Signed)
Patient notified and scheduled for lab next thur, please place orders

## 2017-04-11 NOTE — Telephone Encounter (Signed)
We should increase his lisinopril to 20 mg daily given his blood pressures. He'll need lab work in 1 week. I placed an order. He can take two 10 mg tablets until he runs out of his current prescription and then we can refill at 20 mg. He needs follow-up in one month for recheck of blood pressure.

## 2017-04-11 NOTE — Telephone Encounter (Signed)
Left message to return call 

## 2017-04-12 ENCOUNTER — Encounter: Payer: Self-pay | Admitting: Family Medicine

## 2017-04-12 ENCOUNTER — Ambulatory Visit (INDEPENDENT_AMBULATORY_CARE_PROVIDER_SITE_OTHER): Payer: Managed Care, Other (non HMO) | Admitting: Family Medicine

## 2017-04-12 DIAGNOSIS — R05 Cough: Secondary | ICD-10-CM | POA: Diagnosis not present

## 2017-04-12 DIAGNOSIS — R059 Cough, unspecified: Secondary | ICD-10-CM

## 2017-04-12 MED ORDER — LISINOPRIL 10 MG PO TABS: 10.0000 mg | ORAL_TABLET | Freq: Every day | ORAL | 3 refills | Status: DC

## 2017-04-12 MED ORDER — BENZONATATE 100 MG PO CAPS: 100.0000 mg | ORAL_CAPSULE | Freq: Three times a day (TID) | ORAL | 0 refills | Status: DC | PRN

## 2017-04-12 NOTE — Patient Instructions (Signed)
Use the cough medication as needed.  Consider flonase and/or an antihistamine as well.  If it persists, you may need to stop and change the lisinopril.  Take care  Dr. Lacinda Axon

## 2017-04-12 NOTE — Assessment & Plan Note (Signed)
New problem. Likely from post nasal drip. Treating with tessalon perles.

## 2017-04-12 NOTE — Progress Notes (Signed)
Subjective:  Patient ID: William Yang, male    DOB: [birth year]  Age: 61 y.o. MRN: [id number]  CC: Cough  HPI:  61 year old male presents with complaints of cough.  Patient reports that he's had a dry cough 1 week. Moderate in severity. Has woken him up at night. Patient states that he has some associated right sided dry throat. Occasional right eye watering. He's been using some cough drops without resolution. No other medications or interventions tried. No known exacerbating or relieving factors. No fevers or chills. No other associated symptoms. No other complaints or concerns at this time.  Social Hx   Social History   Social History  . Marital status: Married    Spouse name: N/A  . Number of children: N/A  . Years of education: N/A   Social History Main Topics  . Smoking status: Former Research scientist (life sciences)  . Smokeless tobacco: Never Used  . Alcohol use 12.6 oz/week    21 Standard drinks or equivalent per week     Comment: 21 beers a week   . Drug use: No  . Sexual activity: Not Asked   Other Topics Concern  . None   Social History Narrative  . None    Review of Systems  Constitutional: Negative.   Respiratory: Positive for cough.    Objective:  BP (!) 144/88 (BP Location: Left Arm, Patient Position: Sitting, Cuff Size: Normal)   Pulse 89   Temp 98.6 F (37 C) (Oral)   Resp 16   Wt 188 lb 6 oz (85.4 kg)   SpO2 97%   BMI 29.07 kg/m   BP/Weight 04/12/2017 2/99/2426 03/23/4195  Systolic BP 222 979 892  Diastolic BP 88 92 119  Wt. (Lbs) 188.38 181.6 188.6  BMI 29.07 28.02 29.1    Physical Exam  Constitutional: He is oriented to person, place, and time. He appears well-developed. No distress.  HENT:  Mouth/Throat: Oropharynx is clear and moist.  Eyes: Conjunctivae are normal. Right eye exhibits no discharge. Left eye exhibits discharge.  Neck: Neck supple.  Cardiovascular: Normal rate and regular rhythm.   Pulmonary/Chest: Effort normal. He has no wheezes. He has no  rales.  Lymphadenopathy:    He has no cervical adenopathy.  Neurological: He is alert and oriented to person, place, and time.  Psychiatric: He has a normal mood and affect.  Vitals reviewed.   Lab Results  Component Value Date   GLUCOSE 111 (H) 02/27/2017   CHOL 173 10/31/2015   TRIG 86.0 10/31/2015   HDL 48.40 10/31/2015   LDLDIRECT 114.0 05/07/2016   LDLCALC 107 (H) 10/31/2015   ALT 25 09/04/2016   AST 19 09/04/2016   NA 135 02/27/2017   K 4.1 02/27/2017   CL 101 02/27/2017   CREATININE 1.17 02/27/2017   BUN 19 02/27/2017   CO2 26 02/27/2017   PSA 2.55 10/31/2015   HGBA1C 5.9 02/27/2017    Assessment & Plan:   Problem List Items Addressed This Visit    Cough    New problem. Likely from post nasal drip. Treating with tessalon perles.         Meds ordered this encounter  Medications  . lisinopril (PRINIVIL,ZESTRIL) 10 MG tablet    Sig: Take 1 tablet (10 mg total) by mouth daily.    Dispense:  90 tablet    Refill:  3  . benzonatate (TESSALON) 100 MG capsule    Sig: Take 1 capsule (100 mg total) by mouth 3 (three) times daily as needed.  Dispense:  30 capsule    Refill:  0    Follow-up: PRN  [place]

## 2017-04-18 ENCOUNTER — Other Ambulatory Visit (INDEPENDENT_AMBULATORY_CARE_PROVIDER_SITE_OTHER): Payer: Managed Care, Other (non HMO)

## 2017-04-18 DIAGNOSIS — I10 Essential (primary) hypertension: Secondary | ICD-10-CM

## 2017-04-18 LAB — BASIC METABOLIC PANEL
BUN: 18 mg/dL (ref 6–23)
CHLORIDE: 103 meq/L (ref 96–112)
CO2: 28 meq/L (ref 19–32)
Calcium: 9.4 mg/dL (ref 8.4–10.5)
Creatinine, Ser: 0.94 mg/dL (ref 0.40–1.50)
GFR: 86.43 mL/min (ref >60.00)
GLUCOSE: 112 mg/dL — AB (ref 70–99)
POTASSIUM: 3.5 meq/L (ref 3.5–5.1)
Sodium: 139 mEq/L (ref 135–145)

## 2017-06-03 ENCOUNTER — Ambulatory Visit (INDEPENDENT_AMBULATORY_CARE_PROVIDER_SITE_OTHER): Payer: Managed Care, Other (non HMO) | Admitting: Family Medicine

## 2017-06-03 ENCOUNTER — Encounter: Payer: Self-pay | Admitting: Family Medicine

## 2017-06-03 VITALS — BP 124/84 | HR 83 | Temp 98.7°F | Wt 187.2 lb

## 2017-06-03 DIAGNOSIS — Z8042 Family history of malignant neoplasm of prostate: Secondary | ICD-10-CM

## 2017-06-03 DIAGNOSIS — D229 Melanocytic nevi, unspecified: Secondary | ICD-10-CM | POA: Diagnosis not present

## 2017-06-03 DIAGNOSIS — I10 Essential (primary) hypertension: Secondary | ICD-10-CM | POA: Diagnosis not present

## 2017-06-03 NOTE — Progress Notes (Signed)
  Tommi Rumps, MD Phone: [phone]  William Yang is a 61 y.o. male who presents today for follow-up.  Hypertension: Typically running 136-181/87-95. Taking lisinopril 10 mg daily. Also taking HCTZ. No chest pain, shortness breath, or edema. Did have some cough initially though this was likely related to sinus drainage. Has resolved.  He continues to have the mole in his left supraclavicular area. It does occasionally get irritated. Overall has not really changed. Notes he did not call us back as advised when he did not hear about the dermatology referral. This will be replaced today.  Patient reports he just found out his brother had prostate cancer. Patient is due for PSA. He declines rectal exam.  PMH: Former smoker   ROS see history of present illness  Objective  Physical Exam Vitals:   06/03/17 1600  BP: 124/84  Pulse: 83  Temp: 98.7 F (37.1 C)  SpO2: 96%    BP Readings from Last 3 Encounters:  06/03/17 124/84  04/12/17 (!) 144/88  03/29/17 (!) 130/92   Wt Readings from Last 3 Encounters:  06/03/17 187 lb 4 oz (84.9 kg)  04/12/17 188 lb 6 oz (85.4 kg)  03/29/17 181 lb 9.6 oz (82.4 kg)    Physical Exam  Constitutional: No distress.  Cardiovascular: Normal rate, regular rhythm and normal heart sounds.   Pulmonary/Chest: Effort normal and breath sounds normal.  Musculoskeletal: He exhibits no edema.  Neurological: He is alert. Gait normal.  Skin: Skin is warm and dry. He is not diaphoretic.  Nevus noted left supraclavicular area, unchanged from prior     Assessment/Plan: Please see individual problem list.  Nevus Refer to dermatology.  Essential hypertension Well-controlled today though at home not well controlled most of the time. We'll check a CMP and then likely increase his lisinopril. Follow-up in 7-10 days for recheck of labs and blood pressure with nursing.  Family history of prostate cancer Brother recently diagnosed with prostate cancer.  Check PSA. Patient declines rectal exam.   Orders Placed This Encounter  Procedures  . PSA  . Comp Met (CMET)  . Ambulatory referral to Dermatology    Referral Priority:   Routine    Referral Type:   Consultation    Referral Reason:   Specialty Services Required    Requested Specialty:   Dermatology    Number of Visits Requested:   1    Tommi Rumps, MD [place]

## 2017-06-03 NOTE — Assessment & Plan Note (Signed)
Brother recently diagnosed with prostate cancer. Check PSA. Patient declines rectal exam.

## 2017-06-03 NOTE — Patient Instructions (Signed)
Nice to see you. We'll get lab work and then likely increase your lisinopril. We will refer you to dermatology. We will check a PSA. Please check with your insurance company regarding cologuard and make sure this is covered. Please do not provide sample until you know this is covered.

## 2017-06-03 NOTE — Assessment & Plan Note (Signed)
Well-controlled today though at home not well controlled most of the time. We'll check a CMP and then likely increase his lisinopril. Follow-up in 7-10 days for recheck of labs and blood pressure with nursing.

## 2017-06-03 NOTE — Assessment & Plan Note (Signed)
Refer to dermatology 

## 2017-06-04 LAB — COMPREHENSIVE METABOLIC PANEL
ALT: 22 U/L (ref 0–53)
AST: 18 U/L (ref 0–37)
Albumin: 4.2 g/dL (ref 3.5–5.2)
Alkaline Phosphatase: 62 U/L (ref 39–117)
BILIRUBIN TOTAL: 0.4 mg/dL (ref 0.2–1.2)
BUN: 24 mg/dL — ABNORMAL HIGH (ref 6–23)
CALCIUM: 9.7 mg/dL (ref 8.4–10.5)
CHLORIDE: 102 meq/L (ref 96–112)
CO2: 23 meq/L (ref 19–32)
Creatinine, Ser: 1.06 mg/dL (ref 0.40–1.50)
GFR: 75.21 mL/min (ref >60.00)
GLUCOSE: 92 mg/dL (ref 70–99)
POTASSIUM: 3.9 meq/L (ref 3.5–5.1)
Sodium: 137 mEq/L (ref 135–145)
Total Protein: 6.7 g/dL (ref 6.0–8.3)

## 2017-06-04 LAB — PSA: PSA: 2.46 ng/mL (ref 0.10–4.00)

## 2017-06-06 ENCOUNTER — Other Ambulatory Visit: Payer: Self-pay | Admitting: Family Medicine

## 2017-06-06 DIAGNOSIS — I10 Essential (primary) hypertension: Secondary | ICD-10-CM

## 2017-06-06 MED ORDER — LISINOPRIL 20 MG PO TABS: 20.0000 mg | ORAL_TABLET | Freq: Every day | ORAL | 3 refills | Status: DC

## 2017-06-11 ENCOUNTER — Other Ambulatory Visit (INDEPENDENT_AMBULATORY_CARE_PROVIDER_SITE_OTHER): Payer: Managed Care, Other (non HMO)

## 2017-06-11 DIAGNOSIS — I10 Essential (primary) hypertension: Secondary | ICD-10-CM | POA: Diagnosis not present

## 2017-06-12 ENCOUNTER — Telehealth: Payer: Self-pay | Admitting: Family Medicine

## 2017-06-12 LAB — BASIC METABOLIC PANEL
BUN: 12 mg/dL (ref 6–23)
CALCIUM: 8.9 mg/dL (ref 8.4–10.5)
CO2: 23 meq/L (ref 19–32)
Chloride: 104 mEq/L (ref 96–112)
Creatinine, Ser: 1 mg/dL (ref 0.40–1.50)
GFR: 80.44 mL/min (ref >60.00)
GLUCOSE: 123 mg/dL — AB (ref 70–99)
POTASSIUM: 3.7 meq/L (ref 3.5–5.1)
SODIUM: 136 meq/L (ref 135–145)

## 2017-06-12 NOTE — Telephone Encounter (Signed)
Pt called returning your call. Please advise, thank you!  Call pt @ [phone]

## 2017-06-13 NOTE — Telephone Encounter (Signed)
See result note.  

## 2017-06-17 ENCOUNTER — Other Ambulatory Visit: Payer: Self-pay | Admitting: Family Medicine

## 2017-07-09 ENCOUNTER — Ambulatory Visit: Payer: Managed Care, Other (non HMO)

## 2017-07-09 DIAGNOSIS — I10 Essential (primary) hypertension: Secondary | ICD-10-CM

## 2017-07-09 NOTE — Progress Notes (Addendum)
Patient came in for BP Check.  Left arm was 134/80. Right arm 142/82. Patient stated he has NO sx of shortness of breath, heart flutters, blurry vision, and arm/chest pain.   Reviewed above.  Will forward information to PCP to see if desires any adjustment in medication.    Dr Nicki Reaper

## 2017-07-10 NOTE — Progress Notes (Signed)
FYI.  Signed off on note.  Wanted you to see BP readings.

## 2017-07-25 ENCOUNTER — Other Ambulatory Visit: Payer: Self-pay | Admitting: Family Medicine

## 2017-09-15 ENCOUNTER — Other Ambulatory Visit: Payer: Self-pay | Admitting: Family Medicine

## 2017-09-19 ENCOUNTER — Encounter: Payer: Self-pay | Admitting: Family Medicine

## 2017-09-19 LAB — LIPID PANEL
CHOLESTEROL: 172 (ref 0–200)
HDL: 64 (ref 35–70)
LDL Cholesterol: 88
TRIGLYCERIDES: 122 (ref 40–160)

## 2017-09-19 LAB — TSH: TSH: 0.22 — AB (ref 0.41–5.90)

## 2017-09-19 LAB — HEMOGLOBIN A1C: Hemoglobin A1C: 5.8

## 2017-09-19 LAB — VITAMIN D 25 HYDROXY (VIT D DEFICIENCY, FRACTURES): VIT D 25 HYDROXY: 14.6

## 2017-09-19 LAB — CBC AND DIFFERENTIAL
HCT: 42 (ref 41–53)
HEMOGLOBIN: 14.2 (ref 13.5–17.5)
PLATELETS: 316 (ref 150–399)
WBC: 11.1

## 2017-09-19 LAB — BASIC METABOLIC PANEL
BUN: 17 (ref 4–21)
CREATININE: 0.9 (ref 0.6–1.3)
Glucose: 92
Potassium: 3.8 (ref 3.4–5.3)
Sodium: 142 (ref 137–147)

## 2017-09-19 LAB — HIV ANTIBODY (ROUTINE TESTING W REFLEX): HIV: NONREACTIVE

## 2017-09-19 LAB — HEPATIC FUNCTION PANEL
ALK PHOS: 81 (ref 25–125)
ALT: 31 (ref 10–40)
AST: 14 (ref 14–40)
Bilirubin, Total: 0.3

## 2017-09-19 LAB — VITAMIN B12: VITAMIN B 12: 421

## 2017-09-19 LAB — PSA: PSA: 4.4

## 2017-10-24 ENCOUNTER — Other Ambulatory Visit: Payer: Self-pay | Admitting: Family Medicine

## 2017-10-30 LAB — COLOGUARD: Cologuard: NEGATIVE

## 2017-11-08 ENCOUNTER — Encounter: Payer: Self-pay | Admitting: Family Medicine

## 2017-11-12 ENCOUNTER — Telehealth: Payer: Self-pay

## 2017-11-12 NOTE — Telephone Encounter (Signed)
Notified patient of negative cologuard results

## 2017-11-18 ENCOUNTER — Other Ambulatory Visit: Payer: Self-pay | Admitting: Family Medicine

## 2017-12-18 ENCOUNTER — Encounter: Payer: Self-pay | Admitting: Family Medicine

## 2017-12-18 ENCOUNTER — Ambulatory Visit: Payer: Managed Care, Other (non HMO) | Admitting: Family Medicine

## 2017-12-18 ENCOUNTER — Other Ambulatory Visit: Payer: Self-pay

## 2017-12-18 VITALS — BP 130/72 | HR 62 | Temp 98.6°F | Ht 68.0 in | Wt 180.0 lb

## 2017-12-18 DIAGNOSIS — D72829 Elevated white blood cell count, unspecified: Secondary | ICD-10-CM

## 2017-12-18 DIAGNOSIS — I10 Essential (primary) hypertension: Secondary | ICD-10-CM | POA: Diagnosis not present

## 2017-12-18 DIAGNOSIS — E785 Hyperlipidemia, unspecified: Secondary | ICD-10-CM

## 2017-12-18 DIAGNOSIS — R972 Elevated prostate specific antigen [PSA]: Secondary | ICD-10-CM | POA: Diagnosis not present

## 2017-12-18 NOTE — Assessment & Plan Note (Addendum)
Minimally elevated.  Needs to be rechecked.  Order placed.  Documentation from the [place] will be scanned into his chart.  Discussed if it was abnormal we would refer to urology.  If it is normal he will likely need this rechecked in 6 to 12 months.  He has follow-up with the [place] in August.

## 2017-12-18 NOTE — Patient Instructions (Signed)
Nice to see you. We will get lab work and contact you with the results.

## 2017-12-18 NOTE — Progress Notes (Signed)
  Tommi Rumps, MD Phone: [phone]  William Yang is a 62 y.o. male who presents today for f/u.  HYPERTENSION  Disease Monitoring  Home BP Monitoring not checking Chest pain- no    Dyspnea- no Medications  Compliance-  Taking HCTZ, lisinopril  Edema- no  HYPERLIPIDEMIA Symptoms Chest pain on exertion:  no   Medications: Compliance- taking simvastatin Right upper quadrant pain- no  Muscle aches- no  He had labs through the [place].  His PSA was 4.4 and minimally elevated.  He received a follow-up letter from the [place] advising that they spoke with urology and he needed to have a repeat PSA to see if it was still elevated.  If still elevated would consult urology.  He notes no urinary stream issues.  WBC also minimally elevated.  He is felt well and has had no signs of infection.    Social History   Tobacco Use  Smoking Status Former Smoker  Smokeless Tobacco Never Used     ROS see history of present illness  Objective  Physical Exam Vitals:   12/18/17 1441  BP: 130/72  Pulse: 62  Temp: 98.6 F (37 C)  SpO2: 98%    BP Readings from Last 3 Encounters:  12/18/17 130/72  06/03/17 124/84  04/12/17 (!) 144/88   Wt Readings from Last 3 Encounters:  12/18/17 180 lb (81.6 kg)  06/03/17 187 lb 4 oz (84.9 kg)  04/12/17 188 lb 6 oz (85.4 kg)    Physical Exam  Constitutional: No distress.  Cardiovascular: Normal rate, regular rhythm and normal heart sounds.  Pulmonary/Chest: Effort normal and breath sounds normal.  Musculoskeletal: He exhibits no edema.  Neurological: He is alert.  Skin: Skin is warm and dry. He is not diaphoretic.     Assessment/Plan: Please see individual problem list.  Essential hypertension Adequately controlled.  Continue current regimen.  Hyperlipidemia Well-controlled on recent labs.  Continue simvastatin.  Elevated PSA Minimally elevated.  Needs to be rechecked.  Order placed.  Documentation from the [place] will be scanned into his chart.   Discussed if it was abnormal we would refer to urology.  If it is normal he will likely need this rechecked in 6 to 12 months.  He has follow-up with the [facility] in August.  Leukocytosis Recheck today.   Orders Placed This Encounter  Procedures  . PSA  . CBC w/Diff    No orders of the defined types were placed in this encounter.    Tommi Rumps, MD [place]

## 2017-12-18 NOTE — Assessment & Plan Note (Signed)
Recheck today. 

## 2017-12-18 NOTE — Assessment & Plan Note (Signed)
Well-controlled on recent labs.  Continue simvastatin.

## 2017-12-18 NOTE — Assessment & Plan Note (Signed)
Adequately controlled.  Continue current regimen. 

## 2017-12-19 ENCOUNTER — Other Ambulatory Visit: Payer: Self-pay | Admitting: Family Medicine

## 2017-12-19 DIAGNOSIS — D72829 Elevated white blood cell count, unspecified: Secondary | ICD-10-CM

## 2017-12-19 LAB — CBC WITH DIFFERENTIAL/PLATELET
BASOS ABS: 0.1 10*3/uL (ref 0.0–0.1)
Basophils Relative: 1 % (ref 0.0–3.0)
EOS ABS: 0.3 10*3/uL (ref 0.0–0.7)
Eosinophils Relative: 2.2 % (ref 0.0–5.0)
HEMATOCRIT: 41 % (ref 39.0–52.0)
Hemoglobin: 13.9 g/dL (ref 13.0–17.0)
LYMPHS PCT: 22.7 % (ref 12.0–46.0)
Lymphs Abs: 2.8 10*3/uL (ref 0.7–4.0)
MCHC: 34 g/dL (ref 30.0–36.0)
MCV: 93.5 fl (ref 78.0–100.0)
MONO ABS: 1 10*3/uL (ref 0.1–1.0)
Monocytes Relative: 8.1 % (ref 3.0–12.0)
Neutro Abs: 8.3 10*3/uL — ABNORMAL HIGH (ref 1.4–7.7)
Neutrophils Relative %: 66 % (ref 43.0–77.0)
Platelets: 279 10*3/uL (ref 150.0–400.0)
RBC: 4.39 Mil/uL (ref 4.22–5.81)
RDW: 13.2 % (ref 11.5–15.5)
WBC: 12.6 10*3/uL — AB (ref 4.0–10.5)

## 2017-12-19 LAB — PSA: PSA: 3.52 ng/mL (ref 0.10–4.00)

## 2018-01-21 ENCOUNTER — Other Ambulatory Visit (INDEPENDENT_AMBULATORY_CARE_PROVIDER_SITE_OTHER): Payer: Managed Care, Other (non HMO)

## 2018-01-21 DIAGNOSIS — D72829 Elevated white blood cell count, unspecified: Secondary | ICD-10-CM | POA: Diagnosis not present

## 2018-01-22 ENCOUNTER — Telehealth: Payer: Self-pay

## 2018-01-22 DIAGNOSIS — D649 Anemia, unspecified: Secondary | ICD-10-CM

## 2018-01-22 LAB — CBC WITH DIFFERENTIAL/PLATELET
BASOS ABS: 0.1 10*3/uL (ref 0.0–0.1)
Basophils Relative: 1.1 % (ref 0.0–3.0)
EOS ABS: 0.3 10*3/uL (ref 0.0–0.7)
Eosinophils Relative: 2.9 % (ref 0.0–5.0)
HEMATOCRIT: 38.4 % — AB (ref 39.0–52.0)
HEMOGLOBIN: 13 g/dL (ref 13.0–17.0)
LYMPHS PCT: 36.3 % (ref 12.0–46.0)
Lymphs Abs: 3.8 10*3/uL (ref 0.7–4.0)
MCHC: 33.9 g/dL (ref 30.0–36.0)
MCV: 92.4 fl (ref 78.0–100.0)
Monocytes Absolute: 1 10*3/uL (ref 0.1–1.0)
Monocytes Relative: 9.6 % (ref 3.0–12.0)
NEUTROS ABS: 5.2 10*3/uL (ref 1.4–7.7)
Neutrophils Relative %: 50.1 % (ref 43.0–77.0)
Platelets: 322 10*3/uL (ref 150.0–400.0)
RBC: 4.15 Mil/uL — AB (ref 4.22–5.81)
RDW: 13.3 % (ref 11.5–15.5)
WBC: 10.4 10*3/uL (ref 4.0–10.5)

## 2018-01-22 NOTE — Telephone Encounter (Signed)
-----   Message from Leone Haven, MD sent at 01/22/2018 12:20 PM EDT ----- Please let the patient know that his white blood cell count has improved, though he now appears to be minimally anemic. I would like to recheck this in a couple weeks to see if it is still low. Please place an order for a CBC for a diagnosis of unspecified anemia. Thanks.

## 2018-01-26 ENCOUNTER — Other Ambulatory Visit: Payer: Self-pay | Admitting: Family Medicine

## 2018-02-05 ENCOUNTER — Other Ambulatory Visit (INDEPENDENT_AMBULATORY_CARE_PROVIDER_SITE_OTHER): Payer: Managed Care, Other (non HMO)

## 2018-02-05 DIAGNOSIS — D649 Anemia, unspecified: Secondary | ICD-10-CM

## 2018-02-06 LAB — CBC
HCT: 37.9 % — ABNORMAL LOW (ref 39.0–52.0)
Hemoglobin: 12.9 g/dL — ABNORMAL LOW (ref 13.0–17.0)
MCHC: 34.1 g/dL (ref 30.0–36.0)
MCV: 93.1 fl (ref 78.0–100.0)
Platelets: 349 10*3/uL (ref 150.0–400.0)
RBC: 4.07 Mil/uL — AB (ref 4.22–5.81)
RDW: 13 % (ref 11.5–15.5)
WBC: 13.6 10*3/uL — AB (ref 4.0–10.5)

## 2018-02-12 ENCOUNTER — Encounter: Payer: Self-pay | Admitting: Family Medicine

## 2018-02-12 ENCOUNTER — Ambulatory Visit (INDEPENDENT_AMBULATORY_CARE_PROVIDER_SITE_OTHER): Payer: Managed Care, Other (non HMO) | Admitting: Family Medicine

## 2018-02-12 ENCOUNTER — Other Ambulatory Visit: Payer: Self-pay | Admitting: Family Medicine

## 2018-02-12 VITALS — BP 138/74 | HR 90 | Temp 98.3°F | Resp 20 | Wt 177.1 lb

## 2018-02-12 DIAGNOSIS — S30860A Insect bite (nonvenomous) of lower back and pelvis, initial encounter: Secondary | ICD-10-CM

## 2018-02-12 DIAGNOSIS — W57XXXA Bitten or stung by nonvenomous insect and other nonvenomous arthropods, initial encounter: Secondary | ICD-10-CM

## 2018-02-12 MED ORDER — DOXYCYCLINE HYCLATE 100 MG PO TABS: 100.0000 mg | ORAL_TABLET | Freq: Two times a day (BID) | ORAL | 0 refills | Status: DC

## 2018-02-12 NOTE — Assessment & Plan Note (Signed)
New issue.  Patient with tick bite and subsequent symptoms concerning for tickborne illness.  We will start him on doxycycline.  We will check RMSF titers as well as Lyme titers.  He is given return precautions.

## 2018-02-12 NOTE — Progress Notes (Signed)
Pre-visit discussion using our clinic review tool. No additional management support is needed unless otherwise documented below in the visit note.  

## 2018-02-12 NOTE — Progress Notes (Signed)
  Tommi Rumps, MD Phone: [phone]  William Yang is a 62 y.o. male who presents today for same day visit.  CC: tick bite of buttock  Patient reports 3 tick bites this year.  Last was 2 days ago.  He removed it from his gluteal cleft at the top of his gluteal cleft.  It was dead.  Not engorged.  He has been having some headaches and has felt warm over the last couple of days.  No numbness, weakness, vision changes, rash, nausea, or vomiting.  He has just felt like he has had flulike symptoms.  His wife and daughter both had Lyme disease in the past.  Social History   Tobacco Use  Smoking Status Former Smoker  Smokeless Tobacco Never Used     ROS see history of present illness  Objective  Physical Exam Vitals:   02/12/18 1617  BP: 138/74  Pulse: 90  Resp: 20  Temp: 98.3 F (36.8 C)  SpO2: 98%    BP Readings from Last 3 Encounters:  02/12/18 138/74  12/18/17 130/72  06/03/17 124/84   Wt Readings from Last 3 Encounters:  02/12/18 177 lb 2 oz (80.3 kg)  12/18/17 180 lb (81.6 kg)  06/03/17 187 lb 4 oz (84.9 kg)    Physical Exam  Constitutional: No distress.  Cardiovascular: Normal rate, regular rhythm and normal heart sounds.  Pulmonary/Chest: Effort normal and breath sounds normal.  Musculoskeletal: He exhibits no edema.  Neurological: He is alert.  Skin: Skin is warm and dry. He is not diaphoretic.        Assessment/Plan: Please see individual problem list.  Tick bite of buttock New issue.  Patient with tick bite and subsequent symptoms concerning for tickborne illness.  We will start him on doxycycline.  We will check RMSF titers as well as Lyme titers.  He is given return precautions.  Orders Placed This Encounter  Procedures  . [place] spotted fvr abs pnl(IgG+IgM)  . B. burgdorfi antibodies    Meds ordered this encounter  Medications  . doxycycline (VIBRA-TABS) 100 MG tablet    Sig: Take 1 tablet (100 mg total) by mouth 2 (two) times daily.      Dispense:  14 tablet    Refill:  0     Tommi Rumps, MD [place]

## 2018-02-12 NOTE — Patient Instructions (Signed)
Nice to see you. Please pick up the doxycycline and start this today. We will check lab work and contact you with the results. If you start to feel increasingly ill or you develop fevers, rash, or any new or changing symptoms please seek medical attention immediately.

## 2018-02-13 LAB — B. BURGDORFI ANTIBODIES

## 2018-02-13 LAB — ROCKY MTN SPOTTED FVR ABS PNL(IGG+IGM)
RMSF IgG: DETECTED — AB
RMSF IgM: NOT DETECTED

## 2018-02-13 LAB — REFLEX RMSF IGG TITER: RMSF IgG Titer: 1:128 {titer} — ABNORMAL HIGH

## 2018-02-14 ENCOUNTER — Ambulatory Visit: Payer: Managed Care, Other (non HMO) | Admitting: Family Medicine

## 2018-02-17 ENCOUNTER — Other Ambulatory Visit: Payer: Self-pay | Admitting: Family Medicine

## 2018-02-19 ENCOUNTER — Telehealth: Payer: Self-pay | Admitting: Family Medicine

## 2018-02-19 NOTE — Telephone Encounter (Signed)
Please contact the patient to follow-up with regards to his [facility] spotted fever and see if he has had any fevers, felt feverish, or felt poorly over the last 3 days.  Please ensure that he has been taking the doxycycline.  If he has continued to have any symptoms we should continue doxycycline.  Thanks.

## 2018-02-19 NOTE — Telephone Encounter (Signed)
Left message to return call, ok for pec to speak to patient about message below 

## 2018-02-19 NOTE — Telephone Encounter (Signed)
Copied from [place] [phone]. Topic: General - Other >> Feb 19, 2018  9:45 AM Cecelia Byars, NT wrote: Reason for CRM: Patient called and said he fine not having any problems

## 2018-02-19 NOTE — Telephone Encounter (Signed)
Copied from [place] [id number]. Topic: General - Other >> Feb 19, 2018  9:45 AM Cecelia Byars, NT wrote: Reason for CRM: Patient called and said he fine not having any problems

## 2018-03-12 ENCOUNTER — Ambulatory Visit: Payer: Managed Care, Other (non HMO) | Admitting: Family Medicine

## 2018-03-13 ENCOUNTER — Telehealth: Payer: Self-pay | Admitting: Family Medicine

## 2018-03-13 DIAGNOSIS — D72829 Elevated white blood cell count, unspecified: Secondary | ICD-10-CM

## 2018-03-13 NOTE — Telephone Encounter (Signed)
Pt came in stating he needs to have labs done due to having [place] spider fever. Orders needed please and Thank you!  Call pt @ [phone].

## 2018-03-13 NOTE — Telephone Encounter (Signed)
Referral placed.

## 2018-03-13 NOTE — Telephone Encounter (Signed)
Patient notified and states he is ok with the referral

## 2018-03-13 NOTE — Telephone Encounter (Signed)
Does he need repeat labs for this?

## 2018-03-13 NOTE — Telephone Encounter (Signed)
He does not need follow-up labs for this. It looks like his WBC was previously elevated and I advised hematology referral though [facility] did not send the result not back to me. Please confirm whether or not he is ok with seeing them or another option would be rechecking a CBC with differential and if still elevated referring him. Thanks.

## 2018-03-26 ENCOUNTER — Ambulatory Visit: Payer: Managed Care, Other (non HMO) | Admitting: Family Medicine

## 2018-04-23 ENCOUNTER — Other Ambulatory Visit: Payer: Self-pay | Admitting: Family Medicine

## 2018-06-10 ENCOUNTER — Other Ambulatory Visit: Payer: Self-pay | Admitting: Family Medicine

## 2018-06-25 ENCOUNTER — Ambulatory Visit: Payer: Managed Care, Other (non HMO) | Admitting: Family Medicine

## 2018-06-25 ENCOUNTER — Encounter: Payer: Self-pay | Admitting: Family Medicine

## 2018-06-25 VITALS — BP 120/76 | HR 85 | Temp 98.1°F | Ht 68.0 in | Wt 173.6 lb

## 2018-06-25 DIAGNOSIS — M542 Cervicalgia: Secondary | ICD-10-CM

## 2018-06-25 DIAGNOSIS — D229 Melanocytic nevi, unspecified: Secondary | ICD-10-CM

## 2018-06-25 DIAGNOSIS — N528 Other male erectile dysfunction: Secondary | ICD-10-CM

## 2018-06-25 DIAGNOSIS — R972 Elevated prostate specific antigen [PSA]: Secondary | ICD-10-CM

## 2018-06-25 DIAGNOSIS — Z23 Encounter for immunization: Secondary | ICD-10-CM | POA: Diagnosis not present

## 2018-06-25 DIAGNOSIS — I10 Essential (primary) hypertension: Secondary | ICD-10-CM

## 2018-06-25 DIAGNOSIS — E785 Hyperlipidemia, unspecified: Secondary | ICD-10-CM | POA: Diagnosis not present

## 2018-06-25 DIAGNOSIS — D72829 Elevated white blood cell count, unspecified: Secondary | ICD-10-CM

## 2018-06-25 DIAGNOSIS — N529 Male erectile dysfunction, unspecified: Secondary | ICD-10-CM | POA: Insufficient documentation

## 2018-06-25 DIAGNOSIS — F1010 Alcohol abuse, uncomplicated: Secondary | ICD-10-CM | POA: Diagnosis not present

## 2018-06-25 MED ORDER — HYDROCHLOROTHIAZIDE 25 MG PO TABS: 25.0000 mg | ORAL_TABLET | Freq: Every day | ORAL | 2 refills | Status: DC

## 2018-06-25 NOTE — Assessment & Plan Note (Signed)
Recheck CBC.  Potentially could have been elevated in the setting of prior tick borne illness.  If still elevated we will refer to hematology.

## 2018-06-25 NOTE — Assessment & Plan Note (Signed)
Patient reports he had this rechecked to the [place] and it was normal.

## 2018-06-25 NOTE — Assessment & Plan Note (Signed)
Encouraged decreasing alcohol use.  Advised to cut back slowly.

## 2018-06-25 NOTE — Assessment & Plan Note (Signed)
Well-controlled.  Continue current regimen. 

## 2018-06-25 NOTE — Assessment & Plan Note (Signed)
Improved.  Followed by the [facility] for this.

## 2018-06-25 NOTE — Progress Notes (Signed)
Tommi Rumps, MD Phone: [phone]  William Yang is a 62 y.o. male who presents today for f/u.  CC: Hypertension, hyperlipidemia, overweight, neck pain, tobacco abuse, erectile dysfunction, skin lesion  Hypertension: Similar to today at home.  Taking HCTZ and lisinopril.  No chest pain, shortness of breath, or edema.  Hyperlipidemia: Taking simvastatin.  No right upper quadrant pain or myalgias.  He would be interested in coming off of this if he is able to.  Overweight: He has been eating healthier.  More salads.  Not many fried foods.  More vegetables.  He has cut down on soda.  Not much sweet tea.  He does drink 4-6 beers per day which is down from previously.  Neck pain: He is being followed at the [facility] for this.  They gave him Flexeril which did make him drowsy the next day.  He notes his neck pain has improved.  Meloxicam has been beneficial.  No radiation.  No numbness or weakness.  Tobacco abuse: Smokes 1 pack/day or less.  In the past he quit cold [place] or used nicotine gum.  He does not want Chantix.  Erectile dysfunction: Patient notes he has had issues for a number of months with this.  He notes he has a new male in his life after his wife passed away 2 years ago.  He does note he can feel some movement with physical contact.  He notes they have not been intimate.  He does wake up with erections in the morning that are normal erections.  He notes no depression or anxiety.  He wonders if it could be psychological.  No pain with erections.  He was previously referred to dermatology for skin lesion on his left clavicle though he has not heard anything about this.  He was also referred to hematology for leukocytosis though he never heard anything about that either.  Social History   Tobacco Use  Smoking Status Former Smoker  Smokeless Tobacco Never Used     ROS see history of present illness  Objective  Physical Exam Vitals:   06/25/18 1600  BP: 120/76  Pulse: 85    Temp: 98.1 F (36.7 C)  SpO2: 98%    BP Readings from Last 3 Encounters:  06/25/18 120/76  02/12/18 138/74  12/18/17 130/72   Wt Readings from Last 3 Encounters:  06/25/18 173 lb 9.6 oz (78.7 kg)  02/12/18 177 lb 2 oz (80.3 kg)  12/18/17 180 lb (81.6 kg)    Physical Exam  Constitutional: No distress.  Cardiovascular: Normal rate, regular rhythm and normal heart sounds.  Pulmonary/Chest: Effort normal and breath sounds normal.  Genitourinary:  Genitourinary Comments: Normal circumcised penis, normal scrotum, normal testicles, normal epididymis, normal vas deferens, no inguinal hernias  Musculoskeletal: He exhibits no edema.  Neurological: He is alert.  5/5 strength in bilateral biceps, triceps, grip, quads, hamstrings, plantar and dorsiflexion, sensation to light touch intact in bilateral UE and LE, normal gait  Skin: Skin is warm and dry. He is not diaphoretic.        Assessment/Plan: Please see individual problem list.  Essential hypertension Well-controlled.  Continue current regimen.  Alcohol abuse Encouraged decreasing alcohol use.  Advised to cut back slowly.  Elevated PSA Patient reports he had this rechecked to the [place] and it was normal.  Hyperlipidemia Check lipid panel.  Continue simvastatin.  Leukocytosis Recheck CBC.  Potentially could have been elevated in the setting of prior tick borne illness.  If still elevated we will  refer to hematology.  Nevus Suspect this is actually a seborrheic keratosis.  He will be referred to dermatology.  Erectile dysfunction I suspect there are psychological aspects to his erectile dysfunction.  He denies anxiety and depression.  I offered referral to urology though he deferred this at this time.  Neck pain Improved.  Followed by the [facility] for this.    Orders Placed This Encounter  Procedures  . Flu Vaccine QUAD 6+ mos PF IM (Fluarix Quad PF)  . Comp Met (CMET)  . CBC w/Diff  . Lipid panel  . Ambulatory  referral to Dermatology    Referral Priority:   Routine    Referral Type:   Consultation    Referral Reason:   Specialty Services Required    Requested Specialty:   Dermatology    Number of Visits Requested:   1    Meds ordered this encounter  Medications  . hydrochlorothiazide (HYDRODIURIL) 25 MG tablet    Sig: Take 1 tablet (25 mg total) by mouth daily.    Dispense:  90 tablet    Refill:  2     Tommi Rumps, MD [place]

## 2018-06-25 NOTE — Assessment & Plan Note (Signed)
Check lipid panel.  Continue simvastatin. 

## 2018-06-25 NOTE — Assessment & Plan Note (Signed)
Suspect this is actually a seborrheic keratosis.  He will be referred to dermatology.

## 2018-06-25 NOTE — Assessment & Plan Note (Signed)
I suspect there are psychological aspects to his erectile dysfunction.  He denies anxiety and depression.  I offered referral to urology though he deferred this at this time.

## 2018-06-25 NOTE — Patient Instructions (Signed)
Nice to see you. Please continue to eat healthier.  Please quit smoking. We will check lab work today and contact you with the results. If you do not hear anything regarding the dermatology referral please let us know.

## 2018-06-26 LAB — COMPREHENSIVE METABOLIC PANEL
ALK PHOS: 57 U/L (ref 39–117)
ALT: 18 U/L (ref 0–53)
AST: 20 U/L (ref 0–37)
Albumin: 4.6 g/dL (ref 3.5–5.2)
BILIRUBIN TOTAL: 0.3 mg/dL (ref 0.2–1.2)
BUN: 36 mg/dL — AB (ref 6–23)
CO2: 24 mEq/L (ref 19–32)
Calcium: 9.5 mg/dL (ref 8.4–10.5)
Chloride: 100 mEq/L (ref 96–112)
Creatinine, Ser: 1.44 mg/dL (ref 0.40–1.50)
GFR: 52.63 mL/min — ABNORMAL LOW (ref >60.00)
Glucose, Bld: 93 mg/dL (ref 70–99)
Potassium: 3.9 mEq/L (ref 3.5–5.1)
SODIUM: 133 meq/L — AB (ref 135–145)
TOTAL PROTEIN: 7.1 g/dL (ref 6.0–8.3)

## 2018-06-26 LAB — CBC WITH DIFFERENTIAL/PLATELET
BASOS ABS: 0.2 10*3/uL — AB (ref 0.0–0.1)
Basophils Relative: 1.2 % (ref 0.0–3.0)
Eosinophils Absolute: 0.3 10*3/uL (ref 0.0–0.7)
Eosinophils Relative: 2.4 % (ref 0.0–5.0)
HCT: 42.8 % (ref 39.0–52.0)
HEMOGLOBIN: 14.7 g/dL (ref 13.0–17.0)
Lymphocytes Relative: 24.2 % (ref 12.0–46.0)
Lymphs Abs: 3.5 10*3/uL (ref 0.7–4.0)
MCHC: 34.3 g/dL (ref 30.0–36.0)
MCV: 93.5 fl (ref 78.0–100.0)
MONO ABS: 1.5 10*3/uL — AB (ref 0.1–1.0)
Monocytes Relative: 10.6 % (ref 3.0–12.0)
Neutro Abs: 8.8 10*3/uL — ABNORMAL HIGH (ref 1.4–7.7)
Neutrophils Relative %: 61.6 % (ref 43.0–77.0)
Platelets: 291 10*3/uL (ref 150.0–400.0)
RBC: 4.58 Mil/uL (ref 4.22–5.81)
RDW: 13.1 % (ref 11.5–15.5)
WBC: 14.3 10*3/uL — ABNORMAL HIGH (ref 4.0–10.5)

## 2018-06-26 LAB — LIPID PANEL
CHOLESTEROL: 153 mg/dL (ref 0–200)
HDL: 55.5 mg/dL (ref >39.00)
LDL Cholesterol: 67 mg/dL (ref 0–99)
NonHDL: 97.19
TRIGLYCERIDES: 153 mg/dL — AB (ref 0.0–149.0)
Total CHOL/HDL Ratio: 3
VLDL: 30.6 mg/dL (ref 0.0–40.0)

## 2018-06-27 ENCOUNTER — Other Ambulatory Visit: Payer: Self-pay | Admitting: Family Medicine

## 2018-06-27 DIAGNOSIS — D72829 Elevated white blood cell count, unspecified: Secondary | ICD-10-CM

## 2018-06-27 DIAGNOSIS — N179 Acute kidney failure, unspecified: Secondary | ICD-10-CM

## 2018-07-09 ENCOUNTER — Other Ambulatory Visit (INDEPENDENT_AMBULATORY_CARE_PROVIDER_SITE_OTHER): Payer: Managed Care, Other (non HMO)

## 2018-07-09 DIAGNOSIS — N179 Acute kidney failure, unspecified: Secondary | ICD-10-CM | POA: Diagnosis not present

## 2018-07-10 ENCOUNTER — Encounter: Payer: Self-pay | Admitting: Oncology

## 2018-07-10 ENCOUNTER — Inpatient Hospital Stay: Payer: Managed Care, Other (non HMO)

## 2018-07-10 ENCOUNTER — Other Ambulatory Visit: Payer: Self-pay | Admitting: Family Medicine

## 2018-07-10 ENCOUNTER — Inpatient Hospital Stay: Payer: Managed Care, Other (non HMO) | Attending: Oncology | Admitting: Oncology

## 2018-07-10 VITALS — BP 156/90 | HR 90 | Temp 98.8°F | Resp 18 | Ht 68.0 in | Wt 175.2 lb

## 2018-07-10 DIAGNOSIS — Z79899 Other long term (current) drug therapy: Secondary | ICD-10-CM

## 2018-07-10 DIAGNOSIS — D72829 Elevated white blood cell count, unspecified: Secondary | ICD-10-CM | POA: Insufficient documentation

## 2018-07-10 DIAGNOSIS — F17210 Nicotine dependence, cigarettes, uncomplicated: Secondary | ICD-10-CM

## 2018-07-10 DIAGNOSIS — F1010 Alcohol abuse, uncomplicated: Secondary | ICD-10-CM | POA: Diagnosis not present

## 2018-07-10 DIAGNOSIS — E871 Hypo-osmolality and hyponatremia: Secondary | ICD-10-CM

## 2018-07-10 DIAGNOSIS — I10 Essential (primary) hypertension: Secondary | ICD-10-CM | POA: Diagnosis not present

## 2018-07-10 DIAGNOSIS — E785 Hyperlipidemia, unspecified: Secondary | ICD-10-CM | POA: Diagnosis not present

## 2018-07-10 LAB — CBC WITH DIFFERENTIAL/PLATELET
Abs Immature Granulocytes: 0.06 10*3/uL (ref 0.00–0.07)
BASOS PCT: 1 %
Basophils Absolute: 0.1 10*3/uL (ref 0.0–0.1)
EOS ABS: 0.2 10*3/uL (ref 0.0–0.5)
Eosinophils Relative: 2 %
HCT: 41.1 % (ref 39.0–52.0)
Hemoglobin: 13.9 g/dL (ref 13.0–17.0)
Immature Granulocytes: 1 %
Lymphocytes Relative: 28 %
Lymphs Abs: 3.2 10*3/uL (ref 0.7–4.0)
MCH: 30.5 pg (ref 26.0–34.0)
MCHC: 33.8 g/dL (ref 30.0–36.0)
MCV: 90.3 fL (ref 80.0–100.0)
MONOS PCT: 10 %
Monocytes Absolute: 1.1 10*3/uL — ABNORMAL HIGH (ref 0.1–1.0)
NRBC: 0 % (ref 0.0–0.2)
Neutro Abs: 6.8 10*3/uL (ref 1.7–7.7)
Neutrophils Relative %: 58 %
PLATELETS: 275 10*3/uL (ref 150–400)
RBC: 4.55 MIL/uL (ref 4.22–5.81)
RDW: 12.5 % (ref 11.5–15.5)
WBC: 11.4 10*3/uL — ABNORMAL HIGH (ref 4.0–10.5)

## 2018-07-10 LAB — BASIC METABOLIC PANEL
BUN: 18 mg/dL (ref 6–23)
CO2: 25 meq/L (ref 19–32)
Calcium: 9.1 mg/dL (ref 8.4–10.5)
Chloride: 99 mEq/L (ref 96–112)
Creatinine, Ser: 1.14 mg/dL (ref 0.40–1.50)
GFR: 68.91 mL/min (ref >60.00)
GLUCOSE: 77 mg/dL (ref 70–99)
POTASSIUM: 3.9 meq/L (ref 3.5–5.1)
SODIUM: 133 meq/L — AB (ref 135–145)

## 2018-07-10 LAB — TECHNOLOGIST SMEAR REVIEW: Tech Review: ADEQUATE

## 2018-07-11 ENCOUNTER — Encounter: Payer: Self-pay | Admitting: Oncology

## 2018-07-11 NOTE — Progress Notes (Signed)
Hematology/Oncology Consult note [facility] [phone] [phone]  Patient Care Team: Leone Haven, MD as PCP - General (Family Medicine)   Name of the patient: William Yang  [id number]  1956/04/30    Reason for referral-leukocytosis   Referring physician-Dr. Caryl Bis  Date of visit: 07/11/18   History of presenting illness-patient is a 62 year old male who has been referred to [place] for leukocytosis.  His past medical history significant for hypertension hyperlipidemia.  He is also a current everyday smoker and he smokes about 1 pack/day and has done so for over 30 years.  He has tried to quit intermittently in the past but has been unsuccessful so far.  He also admits to drinking alcohol on a regular basis.  Most recent CBC from 06/25/2018 showed white count of 14.3, H&H of 14.7/42.8 with an MCV of 93.5 and a platelet count of 291.  Differential mainly showed neutrophilia with an ANC of 8.8 with some degree of monocytosis and basophilia.  There are some other condoms available since May 2019 when his white count fluctuates between 10-14 but I do not have any counts before May 2019 for comparison.  Overall patient feels well and reports that his appetite is good and denies any unintentional weight loss.  Denies any recurrent infections or hospitalizations.  Denies any fatigue  ECOG PS- 0  Pain scale- 0   Review of systems- Review of Systems  Constitutional: Negative for chills, fever, malaise/fatigue and weight loss.  HENT: Negative for congestion, ear discharge and nosebleeds.   Eyes: Negative for blurred vision.  Respiratory: Negative for cough, hemoptysis, sputum production, shortness of breath and wheezing.   Cardiovascular: Negative for chest pain, palpitations, orthopnea and claudication.  Gastrointestinal: Negative for abdominal pain, blood in stool, constipation, diarrhea, heartburn, melena, nausea and vomiting.  Genitourinary:  Negative for dysuria, flank pain, frequency, hematuria and urgency.  Musculoskeletal: Negative for back pain, joint pain and myalgias.  Skin: Negative for rash.  Neurological: Negative for dizziness, tingling, focal weakness, seizures, weakness and headaches.  Endo/Heme/Allergies: Does not bruise/bleed easily.  Psychiatric/Behavioral: Negative for depression and suicidal ideas. The patient does not have insomnia.     No Known Allergies  Patient Active Problem List   Diagnosis Date Noted  . Erectile dysfunction 06/25/2018  . Neck pain 06/25/2018  . Elevated PSA 12/18/2017  . Leukocytosis 12/18/2017  . Family history of prostate cancer 06/03/2017  . Cough 04/12/2017  . Nevus 03/29/2017  . Alcohol abuse 03/29/2017  . Plantar fasciitis, bilateral 02/19/2017  . Anxiety 09/04/2016  . Posterior left knee pain 05/07/2016  . Essential hypertension 09/29/2015  . Hyperlipidemia 09/29/2015     Past Medical History:  Diagnosis Date  . Chickenpox   . Hyperlipidemia   . Hypertension      Past Surgical History:  Procedure Laterality Date  . APPENDECTOMY  1964    Social History   Socioeconomic History  . Marital status: Married    Spouse name: Not on file  . Number of children: Not on file  . Years of education: Not on file  . Highest education level: Not on file  Occupational History  . Not on file  Social Needs  . Financial resource strain: Not on file  . Food insecurity:    Worry: Not on file    Inability: Not on file  . Transportation needs:    Medical: Not on file    Non-medical: Not on file  Tobacco Use  . Smoking status:  Current Every Day Smoker  . Smokeless tobacco: Never Used  Substance and Sexual Activity  . Alcohol use: Yes    Alcohol/week: 21.0 standard drinks    Types: 21 Standard drinks or equivalent per week    Comment: 21 beers a week   . Drug use: No  . Sexual activity: Not on file  Lifestyle  . Physical activity:    Days per week: Not on file     Minutes per session: Not on file  . Stress: Not on file  Relationships  . Social connections:    Talks on phone: Not on file    Gets together: Not on file    Attends religious service: Not on file    Active member of club or organization: Not on file    Attends meetings of clubs or organizations: Not on file    Relationship status: Not on file  . Intimate partner violence:    Fear of current or ex partner: Not on file    Emotionally abused: Not on file    Physically abused: Not on file    Forced sexual activity: Not on file  Other Topics Concern  . Not on file  Social History Narrative  . Not on file     Family History  Problem Relation Age of Onset  . Alcoholism Unknown        Parent  . Lung cancer Brother   . Hypertension Unknown        Parent     Current Outpatient Medications:  .  hydrochlorothiazide (HYDRODIURIL) 25 MG tablet, Take 1 tablet (25 mg total) by mouth daily., Disp: 90 tablet, Rfl: 2 .  lisinopril (PRINIVIL,ZESTRIL) 20 MG tablet, TAKE ONE TABLET BY MOUTH DAILY, Disp: 90 tablet, Rfl: 2 .  simvastatin (ZOCOR) 40 MG tablet, TAKE 1 TABLET (40 MG TOTAL) BY MOUTH DAILY., Disp: 90 tablet, Rfl: 2 .  cyclobenzaprine (FLEXERIL) 5 MG tablet, Take 5 mg by mouth., Disp: , Rfl:    Physical exam:  Vitals:   07/10/18 1455  BP: (!) 156/90  Pulse: 90  Resp: 18  Temp: 98.8 F (37.1 C)  TempSrc: Tympanic  SpO2: 99%  Weight: 175 lb 3.2 oz (79.5 kg)  Height: 5\' 8"  (1.727 m)   Physical Exam  Constitutional: He is oriented to person, place, and time. He appears well-developed and well-nourished.  HENT:  Head: Normocephalic and atraumatic.  Eyes: Pupils are equal, round, and reactive to light. EOM are normal.  Neck: Normal range of motion.  Cardiovascular: Normal rate, regular rhythm and normal heart sounds.  Pulmonary/Chest: Effort normal and breath sounds normal.  Abdominal: Soft. Bowel sounds are normal.  No palpable splenomegaly  Lymphadenopathy:  No palpable  cervical, supraclavicular, axillary or inguinal adenopathy   Neurological: He is alert and oriented to person, place, and time.  Skin: Skin is warm and dry.       CMP Latest Ref Rng & Units 07/09/2018  Glucose 70 - 99 mg/dL 77  BUN 6 - 23 mg/dL 18  Creatinine 0.40 - 1.50 mg/dL 1.14  Sodium 135 - 145 mEq/L 133(L)  Potassium 3.5 - 5.1 mEq/L 3.9  Chloride 96 - 112 mEq/L 99  CO2 19 - 32 mEq/L 25  Calcium 8.4 - 10.5 mg/dL 9.1  Total Protein 6.0 - 8.3 g/dL -  Total Bilirubin 0.2 - 1.2 mg/dL -  Alkaline Phos 39 - 117 U/L -  AST 0 - 37 U/L -  ALT 0 - 53 U/L -  CBC Latest Ref Rng & Units 07/10/2018  WBC 4.0 - 10.5 K/uL 11.4(H)  Hemoglobin 13.0 - 17.0 g/dL 13.9  Hematocrit 39.0 - 52.0 % 41.1  Platelets 150 - 400 K/uL 275     Assessment and plan- Patient is a 62 y.o. male referred for leukocytosis  Patient has mild isolated leukocytosis in the absence of overt anemia or thrombocytopenia.  Differential mainly shows neutrophilia and on one occasion he has had mild monocytosis and basophilia.  I suspect this is nonspecific secondary to his underlying smoking.  I will check a CBC with differential today along with smear review of flow cytometry and BCR ABL fish testing.  I will see the patient back in 3 weeks time to discuss the results of the blood work and further management   Thank you for this kind referral and the opportunity to participate in the care of this patient   Visit Diagnosis 1. Leukocytosis, unspecified type     Dr. Randa Evens, MD, MPH [facility] at [facility] [phone] 07/11/2018  12:38 PM

## 2018-07-15 LAB — COMP PANEL: LEUKEMIA/LYMPHOMA

## 2018-07-21 ENCOUNTER — Other Ambulatory Visit (INDEPENDENT_AMBULATORY_CARE_PROVIDER_SITE_OTHER): Payer: Managed Care, Other (non HMO)

## 2018-07-21 DIAGNOSIS — E871 Hypo-osmolality and hyponatremia: Secondary | ICD-10-CM | POA: Diagnosis not present

## 2018-07-21 LAB — BCR-ABL1 FISH
Cells Analyzed: 200
Cells Counted: 200

## 2018-07-22 LAB — BASIC METABOLIC PANEL
BUN: 13 mg/dL (ref 6–23)
CO2: 27 mEq/L (ref 19–32)
Calcium: 9.1 mg/dL (ref 8.4–10.5)
Chloride: 104 mEq/L (ref 96–112)
Creatinine, Ser: 0.98 mg/dL (ref 0.40–1.50)
GFR: 82.04 mL/min (ref >60.00)
Glucose, Bld: 76 mg/dL (ref 70–99)
Potassium: 3.9 mEq/L (ref 3.5–5.1)
Sodium: 140 mEq/L (ref 135–145)

## 2018-08-04 NOTE — Progress Notes (Signed)
Hematology/Oncology Consult note [facility]  Telephone:([phone] [phone]  Patient Care Team: Leone Haven, MD as PCP - General (Family Medicine)   Name of the patient: William Yang  [id number]  [birth year]   Date of visit: 08/04/18  Diagnosis-leukocytosis likely reactive  Chief complaint/ Reason for visit-discuss results of blood work  Heme/Onc history: patient is a 62 year old male who has been referred to [place] for leukocytosis.  His past medical history significant for hypertension hyperlipidemia.  He is also a current everyday smoker and he smokes about 1 pack/day and has done so for over 30 years.  He has tried to quit intermittently in the past but has been unsuccessful so far.  He also admits to drinking alcohol on a regular basis.  Most recent CBC from 06/25/2018 showed white count of 14.3, H&H of 14.7/42.8 with an MCV of 93.5 and a platelet count of 291.  Differential mainly showed neutrophilia with an ANC of 8.8 with some degree of monocytosis and basophilia.  There are some other condoms available since May 2019 when his white count fluctuates between 10-14 but I do not have any counts before May 2019 for comparison.  Results of blood work from 07/10/2018 were as follows: CBC showed white count of 11.4, H&H of 13.9/41.1 with a platelet count of 275.  Differential mainly showed monocytosis with an absolute monocyte count of 1.1.  BCR able fish testing was negative.  Peripheral flow cytometry testing did not reveal any evidence of immunophenotypic abnormality.  Smear review showed unremarkable morphology  Interval history- doing well overall. Denies any specific complaints today. Nofevers, chills. Appetite is good. Denies any unintentional weight loss  ECOG PS- 0 Pain scale- 0 Opioid associated constipation- no  Review of systems- Review of Systems  Constitutional: Negative for chills, fever, malaise/fatigue and weight loss.  HENT:  Negative for congestion, ear discharge and nosebleeds.   Eyes: Negative for blurred vision.  Respiratory: Negative for cough, hemoptysis, sputum production, shortness of breath and wheezing.   Cardiovascular: Negative for chest pain, palpitations, orthopnea and claudication.  Gastrointestinal: Negative for abdominal pain, blood in stool, constipation, diarrhea, heartburn, melena, nausea and vomiting.  Genitourinary: Negative for dysuria, flank pain, frequency, hematuria and urgency.  Musculoskeletal: Negative for back pain, joint pain and myalgias.  Skin: Negative for rash.  Neurological: Negative for dizziness, tingling, focal weakness, seizures, weakness and headaches.  Endo/Heme/Allergies: Does not bruise/bleed easily.  Psychiatric/Behavioral: Negative for depression and suicidal ideas. The patient does not have insomnia.       No Known Allergies   Past Medical History:  Diagnosis Date  . Chickenpox   . Hyperlipidemia   . Hypertension      Past Surgical History:  Procedure Laterality Date  . APPENDECTOMY  1964    Social History   Socioeconomic History  . Marital status: Married    Spouse name: Not on file  . Number of children: Not on file  . Years of education: Not on file  . Highest education level: Not on file  Occupational History  . Not on file  Social Needs  . Financial resource strain: Not on file  . Food insecurity:    Worry: Not on file    Inability: Not on file  . Transportation needs:    Medical: Not on file    Non-medical: Not on file  Tobacco Use  . Smoking status: Current Every Day Smoker  . Smokeless tobacco: Never Used  Substance and Sexual  Activity  . Alcohol use: Yes    Alcohol/week: 21.0 standard drinks    Types: 21 Standard drinks or equivalent per week    Comment: 21 beers a week   . Drug use: No  . Sexual activity: Not on file  Lifestyle  . Physical activity:    Days per week: Not on file    Minutes per session: Not on file  .  Stress: Not on file  Relationships  . Social connections:    Talks on phone: Not on file    Gets together: Not on file    Attends religious service: Not on file    Active member of club or organization: Not on file    Attends meetings of clubs or organizations: Not on file    Relationship status: Not on file  . Intimate partner violence:    Fear of current or ex partner: Not on file    Emotionally abused: Not on file    Physically abused: Not on file    Forced sexual activity: Not on file  Other Topics Concern  . Not on file  Social History Narrative  . Not on file    Family History  Problem Relation Age of Onset  . Alcoholism Unknown        Parent  . Lung cancer Brother   . Hypertension Unknown        Parent     Current Outpatient Medications:  .  cyclobenzaprine (FLEXERIL) 5 MG tablet, Take 5 mg by mouth., Disp: , Rfl:  .  hydrochlorothiazide (HYDRODIURIL) 25 MG tablet, Take 1 tablet (25 mg total) by mouth daily., Disp: 90 tablet, Rfl: 2 .  lisinopril (PRINIVIL,ZESTRIL) 20 MG tablet, TAKE ONE TABLET BY MOUTH DAILY, Disp: 90 tablet, Rfl: 2 .  simvastatin (ZOCOR) 40 MG tablet, TAKE 1 TABLET (40 MG TOTAL) BY MOUTH DAILY., Disp: 90 tablet, Rfl: 2  Physical exam:  Vitals:   08/05/18 1307  BP: (!) 159/95  Pulse: 87  Resp: 18  Temp: 97.9 F (36.6 C)  TempSrc: Tympanic  Weight: 174 lb 3.2 oz (79 kg)   Physical Exam HENT:     Head: Normocephalic and atraumatic.  Eyes:     Pupils: Pupils are equal, round, and reactive to light.  Neck:     Musculoskeletal: Normal range of motion.  Cardiovascular:     Rate and Rhythm: Normal rate and regular rhythm.     Heart sounds: Normal heart sounds.  Pulmonary:     Effort: Pulmonary effort is normal.     Breath sounds: Normal breath sounds.  Abdominal:     General: Bowel sounds are normal.     Palpations: Abdomen is soft.  Skin:    General: Skin is warm and dry.  Neurological:     Mental Status: He is alert and oriented to  person, place, and time.      CMP Latest Ref Rng & Units 07/21/2018  Glucose 70 - 99 mg/dL 76  BUN 6 - 23 mg/dL 13  Creatinine 0.40 - 1.50 mg/dL 0.98  Sodium 135 - 145 mEq/L 140  Potassium 3.5 - 5.1 mEq/L 3.9  Chloride 96 - 112 mEq/L 104  CO2 19 - 32 mEq/L 27  Calcium 8.4 - 10.5 mg/dL 9.1  Total Protein 6.0 - 8.3 g/dL -  Total Bilirubin 0.2 - 1.2 mg/dL -  Alkaline Phos 39 - 117 U/L -  AST 0 - 37 U/L -  ALT 0 - 53 U/L -  CBC Latest Ref Rng & Units 07/10/2018  WBC 4.0 - 10.5 K/uL 11.4(H)  Hemoglobin 13.0 - 17.0 g/dL 13.9  Hematocrit 39.0 - 52.0 % 41.1  Platelets 150 - 400 K/uL 275      Assessment and plan- Patient is a 62 y.o. male referred for leukocytosis likely reactive secondary to smoking  I discussed the results of the blood work with the patient.  Patient has mild leukocytosis with a white count ranging between 11-13 with differential and that mainly shows some neutrophilia and monocytosis.  Results of flow cytometry as well as BCR able fish testing are negative.  I suspect this is secondary to his underlying smoking and can be monitored conservatively without the need for bone marrow biopsy unless his leukocytosis persistently worsens.  Repeat CBC with differential in 6 months in 1 year and I will see him back in 1 years time   Visit Diagnosis 1. Monocytosis   2. Neutrophilia      Dr. Randa Evens, MD, MPH [facility] at [facility] [phone] 08/05/2018 1:25 PM

## 2018-08-05 ENCOUNTER — Inpatient Hospital Stay: Payer: Managed Care, Other (non HMO) | Attending: Oncology | Admitting: Oncology

## 2018-08-05 ENCOUNTER — Encounter: Payer: Self-pay | Admitting: Oncology

## 2018-08-05 ENCOUNTER — Other Ambulatory Visit: Payer: Self-pay

## 2018-08-05 VITALS — BP 159/95 | HR 87 | Temp 97.9°F | Resp 18 | Wt 174.2 lb

## 2018-08-05 DIAGNOSIS — D709 Neutropenia, unspecified: Secondary | ICD-10-CM | POA: Diagnosis not present

## 2018-08-05 DIAGNOSIS — D72821 Monocytosis (symptomatic): Secondary | ICD-10-CM | POA: Diagnosis not present

## 2018-08-05 DIAGNOSIS — F17210 Nicotine dependence, cigarettes, uncomplicated: Secondary | ICD-10-CM

## 2018-08-05 DIAGNOSIS — I10 Essential (primary) hypertension: Secondary | ICD-10-CM | POA: Diagnosis not present

## 2018-08-05 DIAGNOSIS — E785 Hyperlipidemia, unspecified: Secondary | ICD-10-CM | POA: Insufficient documentation

## 2018-08-05 DIAGNOSIS — D729 Disorder of white blood cells, unspecified: Secondary | ICD-10-CM

## 2018-08-05 DIAGNOSIS — Z79899 Other long term (current) drug therapy: Secondary | ICD-10-CM

## 2018-08-05 NOTE — Progress Notes (Signed)
Here for follow up. Per pt" Im doing really well "

## 2018-08-18 ENCOUNTER — Telehealth: Payer: Self-pay | Admitting: Family Medicine

## 2018-08-18 NOTE — Telephone Encounter (Signed)
Pt needs a new refill on lisinopril sent to [facility]  Pt lost his other

## 2018-08-19 MED ORDER — LISINOPRIL 20 MG PO TABS: 20.0000 mg | ORAL_TABLET | Freq: Every day | ORAL | 2 refills | Status: DC

## 2018-08-19 NOTE — Telephone Encounter (Signed)
Called patient's home number and left a detailed VM that RX has been sent into pharmacy but he might have to pay out of pocket since he lost his Rx and it is too soon to fill.

## 2018-08-19 NOTE — Telephone Encounter (Signed)
Called patient and was unable to leave a VM to call back due to mailbox being full.  

## 2018-08-19 NOTE — Telephone Encounter (Signed)
Rx has been sent  

## 2018-11-13 ENCOUNTER — Other Ambulatory Visit: Payer: Self-pay | Admitting: Family Medicine

## 2018-11-21 ENCOUNTER — Other Ambulatory Visit: Payer: Self-pay | Admitting: Family Medicine

## 2019-02-03 ENCOUNTER — Other Ambulatory Visit: Payer: Self-pay

## 2019-02-04 ENCOUNTER — Inpatient Hospital Stay: Payer: No Typology Code available for payment source | Attending: Oncology

## 2019-02-04 ENCOUNTER — Other Ambulatory Visit: Payer: Self-pay

## 2019-02-04 DIAGNOSIS — D72821 Monocytosis (symptomatic): Secondary | ICD-10-CM | POA: Insufficient documentation

## 2019-02-04 DIAGNOSIS — D729 Disorder of white blood cells, unspecified: Secondary | ICD-10-CM

## 2019-02-04 LAB — CBC WITH DIFFERENTIAL/PLATELET
Abs Immature Granulocytes: 0.06 10*3/uL (ref 0.00–0.07)
Basophils Absolute: 0.1 10*3/uL (ref 0.0–0.1)
Basophils Relative: 1 %
Eosinophils Absolute: 0.2 10*3/uL (ref 0.0–0.5)
Eosinophils Relative: 2 %
HCT: 40 % (ref 39.0–52.0)
Hemoglobin: 13.9 g/dL (ref 13.0–17.0)
Immature Granulocytes: 1 %
Lymphocytes Relative: 21 %
Lymphs Abs: 2.5 10*3/uL (ref 0.7–4.0)
MCH: 32 pg (ref 26.0–34.0)
MCHC: 34.8 g/dL (ref 30.0–36.0)
MCV: 92.2 fL (ref 80.0–100.0)
Monocytes Absolute: 1 10*3/uL (ref 0.1–1.0)
Monocytes Relative: 8 %
Neutro Abs: 8.1 10*3/uL — ABNORMAL HIGH (ref 1.7–7.7)
Neutrophils Relative %: 67 %
Platelets: 276 10*3/uL (ref 150–400)
RBC: 4.34 MIL/uL (ref 4.22–5.81)
RDW: 12.5 % (ref 11.5–15.5)
WBC: 11.9 10*3/uL — ABNORMAL HIGH (ref 4.0–10.5)
nRBC: 0 % (ref 0.0–0.2)

## 2019-03-25 ENCOUNTER — Other Ambulatory Visit: Payer: Self-pay | Admitting: Family Medicine

## 2019-04-30 ENCOUNTER — Other Ambulatory Visit: Payer: Self-pay | Admitting: Family Medicine

## 2019-05-01 ENCOUNTER — Other Ambulatory Visit: Payer: Self-pay

## 2019-05-04 ENCOUNTER — Encounter: Payer: Self-pay | Admitting: Family Medicine

## 2019-05-04 ENCOUNTER — Other Ambulatory Visit: Payer: Self-pay

## 2019-05-04 ENCOUNTER — Ambulatory Visit (INDEPENDENT_AMBULATORY_CARE_PROVIDER_SITE_OTHER): Payer: No Typology Code available for payment source | Admitting: Family Medicine

## 2019-05-04 VITALS — BP 115/70 | HR 89 | Temp 98.6°F | Ht 66.0 in | Wt 166.0 lb

## 2019-05-04 DIAGNOSIS — R7303 Prediabetes: Secondary | ICD-10-CM | POA: Diagnosis not present

## 2019-05-04 DIAGNOSIS — I10 Essential (primary) hypertension: Secondary | ICD-10-CM

## 2019-05-04 DIAGNOSIS — R972 Elevated prostate specific antigen [PSA]: Secondary | ICD-10-CM | POA: Diagnosis not present

## 2019-05-04 DIAGNOSIS — F1010 Alcohol abuse, uncomplicated: Secondary | ICD-10-CM

## 2019-05-04 DIAGNOSIS — E785 Hyperlipidemia, unspecified: Secondary | ICD-10-CM | POA: Diagnosis not present

## 2019-05-04 DIAGNOSIS — D72829 Elevated white blood cell count, unspecified: Secondary | ICD-10-CM

## 2019-05-04 NOTE — Assessment & Plan Note (Signed)
Check A1c. 

## 2019-05-04 NOTE — Assessment & Plan Note (Signed)
Adequately controlled today.  He will continue his current regimen.  Check lab work.

## 2019-05-04 NOTE — Patient Instructions (Signed)
Nice to see you. Please continue to progressively cut back on your alcohol intake. We will check lab work today and contact you with results.

## 2019-05-04 NOTE — Assessment & Plan Note (Signed)
He reports he had this checked at the [place] earlier this year.  We will request records.

## 2019-05-04 NOTE — Assessment & Plan Note (Signed)
Congratulated on decreasing alcohol intake.  I encouraged him to continue to decrease this.  Discussed risk of liver damage and other chronic medical issues with excessive alcohol intake.

## 2019-05-04 NOTE — Assessment & Plan Note (Signed)
Recheck CBC. 

## 2019-05-04 NOTE — Assessment & Plan Note (Signed)
Continue simvastatin.  Check LDL.

## 2019-05-04 NOTE — Progress Notes (Signed)
  Tommi Rumps, MD Phone: [phone]  William Yang is a 63 y.o. male who presents today for follow-up.  Hypertension: Not checking blood pressures.  Taking HCTZ and lisinopril.  No chest pain, shortness breath, or edema.  Hyperlipidemia: Taking simvastatin.  No right upper quadrant pain or myalgias.  Elevated PSA: He reports he had this rechecked at the [place] earlier this year and it was normal.  We will request records.  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  Has been taking meloxicam half a tablet daily and Flexeril at night.  He notes no radiation of the pain.  No numbness or weakness.  Current regimen is somewhat helpful.  They have not had him do physical therapy.  Alcohol abuse: Drinking anywhere from 0-8 beers daily.  She drinks a case of on average a week.  He has been cutting back progressively.  Some days he does not drink any alcohol at all.  Social History   Tobacco Use  Smoking Status Current Every Day Smoker  Smokeless Tobacco Never Used     ROS see history of present illness  Objective  Physical Exam Vitals:   05/04/19 1535  BP: 115/70  Pulse: 89  Temp: 98.6 F (37 C)  SpO2: 99%    BP Readings from Last 3 Encounters:  05/04/19 115/70  08/05/18 (!) 159/95  07/10/18 (!) 156/90   Wt Readings from Last 3 Encounters:  05/04/19 166 lb (75.3 kg)  08/05/18 174 lb 3.2 oz (79 kg)  07/10/18 175 lb 3.2 oz (79.5 kg)    Physical Exam Constitutional:      General: He is not in acute distress.    Appearance: He is not diaphoretic.  Cardiovascular:     Rate and Rhythm: Normal rate and regular rhythm.     Heart sounds: Normal heart sounds.  Pulmonary:     Effort: Pulmonary effort is normal.     Breath sounds: Normal breath sounds.  Musculoskeletal:     Right lower leg: No edema.     Left lower leg: No edema.  Skin:    General: Skin is warm and dry.  Neurological:     Mental Status: He is alert.      Assessment/Plan: Please see individual  problem list.  Essential hypertension Adequately controlled today.  He will continue his current regimen.  Check lab work.  Alcohol abuse Congratulated on decreasing alcohol intake.  I encouraged him to continue to decrease this.  Discussed risk of liver damage and other chronic medical issues with excessive alcohol intake.  Elevated PSA He reports he had this checked at the [place] earlier this year.  We will request records.  Leukocytosis Recheck CBC.  Hyperlipidemia Continue simvastatin.  Check LDL.  Prediabetes Check A1c.   Orders Placed This Encounter  Procedures  . CBC with Differential/Platelet  . Comp Met (CMET)  . HgB A1c  . Direct LDL    No orders of the defined types were placed in this encounter.    Tommi Rumps, MD [place]

## 2019-05-05 LAB — CBC WITH DIFFERENTIAL/PLATELET
Basophils Absolute: 0.1 10*3/uL (ref 0.0–0.1)
Basophils Relative: 0.9 % (ref 0.0–3.0)
Eosinophils Absolute: 0.3 10*3/uL (ref 0.0–0.7)
Eosinophils Relative: 3.2 % (ref 0.0–5.0)
HCT: 41.3 % (ref 39.0–52.0)
Hemoglobin: 14.1 g/dL (ref 13.0–17.0)
Lymphocytes Relative: 27.1 % (ref 12.0–46.0)
Lymphs Abs: 2.9 10*3/uL (ref 0.7–4.0)
MCHC: 34.1 g/dL (ref 30.0–36.0)
MCV: 96.2 fl (ref 78.0–100.0)
Monocytes Absolute: 1 10*3/uL (ref 0.1–1.0)
Monocytes Relative: 9.2 % (ref 3.0–12.0)
Neutro Abs: 6.3 10*3/uL (ref 1.4–7.7)
Neutrophils Relative %: 59.6 % (ref 43.0–77.0)
Platelets: 295 10*3/uL (ref 150.0–400.0)
RBC: 4.29 Mil/uL (ref 4.22–5.81)
RDW: 13.1 % (ref 11.5–15.5)
WBC: 10.5 10*3/uL (ref 4.0–10.5)

## 2019-05-05 LAB — COMPREHENSIVE METABOLIC PANEL
ALT: 14 U/L (ref 0–53)
AST: 15 U/L (ref 0–37)
Albumin: 4.2 g/dL (ref 3.5–5.2)
Alkaline Phosphatase: 54 U/L (ref 39–117)
BUN: 21 mg/dL (ref 6–23)
CO2: 26 mEq/L (ref 19–32)
Calcium: 9.8 mg/dL (ref 8.4–10.5)
Chloride: 100 mEq/L (ref 96–112)
Creatinine, Ser: 1.15 mg/dL (ref 0.40–1.50)
GFR: 64.02 mL/min (ref >60.00)
Glucose, Bld: 88 mg/dL (ref 70–99)
Potassium: 4.3 mEq/L (ref 3.5–5.1)
Sodium: 135 mEq/L (ref 135–145)
Total Bilirubin: 0.4 mg/dL (ref 0.2–1.2)
Total Protein: 6.3 g/dL (ref 6.0–8.3)

## 2019-05-05 LAB — HEMOGLOBIN A1C: Hgb A1c MFr Bld: 6 % (ref 4.6–6.5)

## 2019-05-05 LAB — LDL CHOLESTEROL, DIRECT: Direct LDL: 80 mg/dL

## 2019-05-21 ENCOUNTER — Other Ambulatory Visit: Payer: Self-pay | Admitting: Family Medicine

## 2019-05-30 ENCOUNTER — Other Ambulatory Visit: Payer: Self-pay | Admitting: Family Medicine

## 2019-07-04 ENCOUNTER — Other Ambulatory Visit: Payer: Self-pay | Admitting: Family Medicine

## 2019-07-29 ENCOUNTER — Other Ambulatory Visit: Payer: Self-pay

## 2019-07-29 ENCOUNTER — Inpatient Hospital Stay: Payer: No Typology Code available for payment source | Attending: Oncology

## 2019-07-29 DIAGNOSIS — D72829 Elevated white blood cell count, unspecified: Secondary | ICD-10-CM | POA: Insufficient documentation

## 2019-07-29 DIAGNOSIS — I10 Essential (primary) hypertension: Secondary | ICD-10-CM | POA: Insufficient documentation

## 2019-07-29 DIAGNOSIS — F17210 Nicotine dependence, cigarettes, uncomplicated: Secondary | ICD-10-CM | POA: Insufficient documentation

## 2019-07-29 DIAGNOSIS — D729 Disorder of white blood cells, unspecified: Secondary | ICD-10-CM

## 2019-07-29 DIAGNOSIS — Z791 Long term (current) use of non-steroidal anti-inflammatories (NSAID): Secondary | ICD-10-CM | POA: Diagnosis not present

## 2019-07-29 DIAGNOSIS — Z801 Family history of malignant neoplasm of trachea, bronchus and lung: Secondary | ICD-10-CM | POA: Insufficient documentation

## 2019-07-29 DIAGNOSIS — E785 Hyperlipidemia, unspecified: Secondary | ICD-10-CM | POA: Diagnosis not present

## 2019-07-29 DIAGNOSIS — Z79899 Other long term (current) drug therapy: Secondary | ICD-10-CM | POA: Insufficient documentation

## 2019-07-29 DIAGNOSIS — Z8249 Family history of ischemic heart disease and other diseases of the circulatory system: Secondary | ICD-10-CM | POA: Diagnosis not present

## 2019-07-29 DIAGNOSIS — D72821 Monocytosis (symptomatic): Secondary | ICD-10-CM

## 2019-07-29 LAB — CBC WITH DIFFERENTIAL/PLATELET
Abs Immature Granulocytes: 0.1 10*3/uL — ABNORMAL HIGH (ref 0.00–0.07)
Basophils Absolute: 0.1 10*3/uL (ref 0.0–0.1)
Basophils Relative: 1 %
Eosinophils Absolute: 0.4 10*3/uL (ref 0.0–0.5)
Eosinophils Relative: 3 %
HCT: 41.6 % (ref 39.0–52.0)
Hemoglobin: 13.8 g/dL (ref 13.0–17.0)
Immature Granulocytes: 1 %
Lymphocytes Relative: 22 %
Lymphs Abs: 3.1 10*3/uL (ref 0.7–4.0)
MCH: 30.9 pg (ref 26.0–34.0)
MCHC: 33.2 g/dL (ref 30.0–36.0)
MCV: 93.3 fL (ref 80.0–100.0)
Monocytes Absolute: 1.3 10*3/uL — ABNORMAL HIGH (ref 0.1–1.0)
Monocytes Relative: 9 %
Neutro Abs: 9.4 10*3/uL — ABNORMAL HIGH (ref 1.7–7.7)
Neutrophils Relative %: 64 %
Platelets: 280 10*3/uL (ref 150–400)
RBC: 4.46 MIL/uL (ref 4.22–5.81)
RDW: 12.3 % (ref 11.5–15.5)
WBC: 14.5 10*3/uL — ABNORMAL HIGH (ref 4.0–10.5)
nRBC: 0 % (ref 0.0–0.2)

## 2019-08-03 ENCOUNTER — Other Ambulatory Visit: Payer: Self-pay | Admitting: Family Medicine

## 2019-08-06 ENCOUNTER — Other Ambulatory Visit: Payer: Self-pay

## 2019-08-06 ENCOUNTER — Other Ambulatory Visit: Payer: Managed Care, Other (non HMO)

## 2019-08-06 ENCOUNTER — Encounter: Payer: Self-pay | Admitting: Oncology

## 2019-08-06 ENCOUNTER — Inpatient Hospital Stay (HOSPITAL_BASED_OUTPATIENT_CLINIC_OR_DEPARTMENT_OTHER): Payer: No Typology Code available for payment source | Admitting: Oncology

## 2019-08-06 ENCOUNTER — Ambulatory Visit: Payer: Managed Care, Other (non HMO) | Admitting: Oncology

## 2019-08-06 VITALS — BP 135/86 | HR 102 | Temp 99.5°F | Wt 171.0 lb

## 2019-08-06 DIAGNOSIS — D729 Disorder of white blood cells, unspecified: Secondary | ICD-10-CM | POA: Diagnosis not present

## 2019-08-06 DIAGNOSIS — D72829 Elevated white blood cell count, unspecified: Secondary | ICD-10-CM | POA: Diagnosis not present

## 2019-08-06 NOTE — Progress Notes (Signed)
Patient stated that he had been doing well with no complaints. 

## 2019-08-07 NOTE — Progress Notes (Signed)
Hematology/Oncology Consult note [facility]  [phone] [phone]  Patient Care Team: Leone Haven, MD as PCP - General (Family Medicine) Sindy Guadeloupe, MD as Consulting Physician (Oncology)   Name of the patient: William Yang  [id number]  1956/01/03   Date of visit: 08/07/19  Diagnosis-leukocytosis likely reactive secondary to smoking  Chief complaint/ Reason for visit-routine follow-up of leukocytosis  Heme/Onc history: patient is a 63 year old male who has been referred to [place] for leukocytosis. His past medical history significant for hypertension hyperlipidemia. He is also a current everyday smoker and he smokes about 1 pack/day and has done so for over 30 years. He has tried to quit intermittently in the past but has been unsuccessful so far. He also admits to drinking alcohol on a regular basis. Most recent CBC from 06/25/2018 showed white count of 14.3, H&H of 14.7/42.8 with an MCV of 93.5 and a platelet count of 291. Differential mainly showed neutrophilia with an ANC of 8.8 with some degree of monocytosis and basophilia. There are some other condoms available since May 2019 when his white count fluctuates between 10-14 but I do not have any counts before May 2019 for comparison.  Results of blood work from 07/10/2018 were as follows: CBC showed white count of 11.4, H&H of 13.9/41.1 with a platelet count of 275.  Differential mainly showed monocytosis with an absolute monocyte count of 1.1.  BCR able fish testing was negative.  Peripheral flow cytometry testing did not reveal any evidence of immunophenotypic abnormality.  Smear review showed unremarkable morphology   Interval history-patient smokes about a pack every 3 weeks but hopes to again quit soon.  Denies any recurrent infections or hospitalizations.  Denies any fatigue unintentional weight loss  ECOG PS- 0 Pain scale- 0   Review of systems- Review of Systems    Constitutional: Negative for chills, fever, malaise/fatigue and weight loss.  HENT: Negative for congestion, ear discharge and nosebleeds.   Eyes: Negative for blurred vision.  Respiratory: Negative for cough, hemoptysis, sputum production, shortness of breath and wheezing.   Cardiovascular: Negative for chest pain, palpitations, orthopnea and claudication.  Gastrointestinal: Negative for abdominal pain, blood in stool, constipation, diarrhea, heartburn, melena, nausea and vomiting.  Genitourinary: Negative for dysuria, flank pain, frequency, hematuria and urgency.  Musculoskeletal: Negative for back pain, joint pain and myalgias.  Skin: Negative for rash.  Neurological: Negative for dizziness, tingling, focal weakness, seizures, weakness and headaches.  Endo/Heme/Allergies: Does not bruise/bleed easily.  Psychiatric/Behavioral: Negative for depression and suicidal ideas. The patient does not have insomnia.       No Known Allergies   Past Medical History:  Diagnosis Date  . Chickenpox   . Hyperlipidemia   . Hypertension      Past Surgical History:  Procedure Laterality Date  . APPENDECTOMY  1964    Social History   Socioeconomic History  . Marital status: Married    Spouse name: Not on file  . Number of children: Not on file  . Years of education: Not on file  . Highest education level: Not on file  Occupational History  . Not on file  Tobacco Use  . Smoking status: Current Every Day Smoker    Packs/day: 1.00    Years: 45.00    Pack years: 45.00    Types: Cigarettes  . Smokeless tobacco: Never Used  Substance and Sexual Activity  . Alcohol use: Yes    Alcohol/week: 21.0 standard drinks  Types: 21 Standard drinks or equivalent per week    Comment: 21 beers a week   . Drug use: No  . Sexual activity: Not on file  Other Topics Concern  . Not on file  Social History Narrative  . Not on file   Social Determinants of Health   Financial Resource Strain:   .  Difficulty of Paying Living Expenses: Not on file  Food Insecurity:   . Worried About Charity fundraiser in the Last Year: Not on file  . Ran Out of Food in the Last Year: Not on file  Transportation Needs:   . Lack of Transportation (Medical): Not on file  . Lack of Transportation (Non-Medical): Not on file  Physical Activity:   . Days of Exercise per Week: Not on file  . Minutes of Exercise per Session: Not on file  Stress:   . Feeling of Stress : Not on file  Social Connections:   . Frequency of Communication with Friends and Family: Not on file  . Frequency of Social Gatherings with Friends and Family: Not on file  . Attends Religious Services: Not on file  . Active Member of Clubs or Organizations: Not on file  . Attends Archivist Meetings: Not on file  . Marital Status: Not on file  Intimate Partner Violence:   . Fear of Current or Ex-Partner: Not on file  . Emotionally Abused: Not on file  . Physically Abused: Not on file  . Sexually Abused: Not on file    Family History  Problem Relation Age of Onset  . Alcoholism Unknown        Parent  . Lung cancer Brother   . Hypertension Unknown        Parent     Current Outpatient Medications:  .  hydrochlorothiazide (HYDRODIURIL) 25 MG tablet, TAKE ONE TABLET BY MOUTH DAILY, Disp: 30 tablet, Rfl: 0 .  lisinopril (ZESTRIL) 20 MG tablet, TAKE ONE TABLET BY MOUTH DAILY, Disp: 90 tablet, Rfl: 0 .  meloxicam (MOBIC) 7.5 MG tablet, Take 7.5 mg by mouth daily., Disp: , Rfl:  .  Probiotic Product (PROBIOTIC-10 PO), Take 1 tablet by mouth daily., Disp: , Rfl:  .  simvastatin (ZOCOR) 40 MG tablet, TAKE ONE TABLET BY MOUTH DAILY, Disp: 90 tablet, Rfl: 0  Physical exam:  Vitals:   08/06/19 1420  BP: 135/86  Pulse: (!) 102  Temp: 99.5 F (37.5 C)  TempSrc: Tympanic  Weight: 171 lb (77.6 kg)   Physical Exam HENT:     Head: Normocephalic and atraumatic.  Eyes:     Pupils: Pupils are equal, round, and reactive to  light.  Cardiovascular:     Rate and Rhythm: Normal rate and regular rhythm.     Heart sounds: Normal heart sounds.  Pulmonary:     Effort: Pulmonary effort is normal.     Breath sounds: Normal breath sounds.  Abdominal:     General: Bowel sounds are normal.     Palpations: Abdomen is soft.  Musculoskeletal:     Cervical back: Normal range of motion.  Lymphadenopathy:     Comments: No palpable cervical, supraclavicular, axillary or inguinal adenopathy   Skin:    General: Skin is warm and dry.  Neurological:     Mental Status: He is alert and oriented to person, place, and time.      CMP Latest Ref Rng & Units 05/04/2019  Glucose 70 - 99 mg/dL 88  BUN 6 - 23  mg/dL 21  Creatinine 0.40 - 1.50 mg/dL 1.15  Sodium 135 - 145 mEq/L 135  Potassium 3.5 - 5.1 mEq/L 4.3  Chloride 96 - 112 mEq/L 100  CO2 19 - 32 mEq/L 26  Calcium 8.4 - 10.5 mg/dL 9.8  Total Protein 6.0 - 8.3 g/dL 6.3  Total Bilirubin 0.2 - 1.2 mg/dL 0.4  Alkaline Phos 39 - 117 U/L 54  AST 0 - 37 U/L 15  ALT 0 - 53 U/L 14   CBC Latest Ref Rng & Units 07/29/2019  WBC 4.0 - 10.5 K/uL 14.5(H)  Hemoglobin 13.0 - 17.0 g/dL 13.8  Hematocrit 39.0 - 52.0 % 41.6  Platelets 150 - 400 K/uL 280      Assessment and plan- Patient is a 63 y.o. 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  Differential mainly shows neutrophilia.  No other cytopenias or thrombocytosis.  I will see him back in 1 years time   Visit Diagnosis 1. Neutrophilia      Dr. Randa Evens, MD, MPH [facility] [id number] 08/07/2019 10:07 AM

## 2019-08-19 ENCOUNTER — Other Ambulatory Visit: Payer: Self-pay | Admitting: Family Medicine

## 2019-08-30 ENCOUNTER — Other Ambulatory Visit: Payer: Self-pay | Admitting: Family Medicine

## 2019-09-30 ENCOUNTER — Other Ambulatory Visit: Payer: Self-pay | Admitting: Family Medicine

## 2019-11-02 ENCOUNTER — Ambulatory Visit: Payer: No Typology Code available for payment source | Admitting: Family Medicine

## 2019-11-02 ENCOUNTER — Other Ambulatory Visit: Payer: Self-pay | Admitting: Family Medicine

## 2019-11-15 ENCOUNTER — Other Ambulatory Visit: Payer: Self-pay | Admitting: Family Medicine

## 2019-12-05 ENCOUNTER — Other Ambulatory Visit: Payer: Self-pay | Admitting: Family Medicine

## 2020-01-09 ENCOUNTER — Other Ambulatory Visit: Payer: Self-pay | Admitting: Family Medicine

## 2020-02-12 ENCOUNTER — Other Ambulatory Visit: Payer: Self-pay | Admitting: Family Medicine

## 2020-02-12 ENCOUNTER — Other Ambulatory Visit: Payer: Self-pay | Admitting: Internal Medicine

## 2020-03-11 ENCOUNTER — Other Ambulatory Visit: Payer: Self-pay | Admitting: Family Medicine

## 2020-04-11 ENCOUNTER — Other Ambulatory Visit: Payer: Self-pay | Admitting: Family Medicine

## 2020-05-09 ENCOUNTER — Other Ambulatory Visit: Payer: Self-pay | Admitting: Family Medicine

## 2020-05-10 ENCOUNTER — Other Ambulatory Visit: Payer: Self-pay | Admitting: Family Medicine

## 2020-06-17 ENCOUNTER — Other Ambulatory Visit: Payer: Self-pay | Admitting: Family Medicine

## 2020-06-20 ENCOUNTER — Ambulatory Visit (INDEPENDENT_AMBULATORY_CARE_PROVIDER_SITE_OTHER): Payer: Medicare HMO | Admitting: Family Medicine

## 2020-06-20 ENCOUNTER — Other Ambulatory Visit: Payer: Self-pay

## 2020-06-20 ENCOUNTER — Encounter: Payer: Self-pay | Admitting: Family Medicine

## 2020-06-20 DIAGNOSIS — R972 Elevated prostate specific antigen [PSA]: Secondary | ICD-10-CM

## 2020-06-20 DIAGNOSIS — I10 Essential (primary) hypertension: Secondary | ICD-10-CM

## 2020-06-20 DIAGNOSIS — R7303 Prediabetes: Secondary | ICD-10-CM | POA: Diagnosis not present

## 2020-06-20 DIAGNOSIS — L918 Other hypertrophic disorders of the skin: Secondary | ICD-10-CM | POA: Insufficient documentation

## 2020-06-20 DIAGNOSIS — N529 Male erectile dysfunction, unspecified: Secondary | ICD-10-CM | POA: Diagnosis not present

## 2020-06-20 DIAGNOSIS — L219 Seborrheic dermatitis, unspecified: Secondary | ICD-10-CM

## 2020-06-20 DIAGNOSIS — E785 Hyperlipidemia, unspecified: Secondary | ICD-10-CM

## 2020-06-20 LAB — COMPREHENSIVE METABOLIC PANEL
ALT: 17 U/L (ref 0–53)
AST: 15 U/L (ref 0–37)
Albumin: 4.4 g/dL (ref 3.5–5.2)
Alkaline Phosphatase: 70 U/L (ref 39–117)
BUN: 13 mg/dL (ref 6–23)
CO2: 25 mEq/L (ref 19–32)
Calcium: 9.6 mg/dL (ref 8.4–10.5)
Chloride: 102 mEq/L (ref 96–112)
Creatinine, Ser: 0.98 mg/dL (ref 0.40–1.50)
GFR: 81.11 mL/min (ref >60.00)
Glucose, Bld: 88 mg/dL (ref 70–99)
Potassium: 4.4 mEq/L (ref 3.5–5.1)
Sodium: 139 mEq/L (ref 135–145)
Total Bilirubin: 0.5 mg/dL (ref 0.2–1.2)
Total Protein: 6.8 g/dL (ref 6.0–8.3)

## 2020-06-20 LAB — LIPID PANEL
Cholesterol: 170 mg/dL (ref 0–200)
HDL: 53.8 mg/dL (ref >39.00)
NonHDL: 116.23
Total CHOL/HDL Ratio: 3
Triglycerides: 205 mg/dL — ABNORMAL HIGH (ref 0.0–149.0)
VLDL: 41 mg/dL — ABNORMAL HIGH (ref 0.0–40.0)

## 2020-06-20 LAB — HEMOGLOBIN A1C: Hgb A1c MFr Bld: 6.1 % (ref 4.6–6.5)

## 2020-06-20 LAB — PSA: PSA: 4.46 ng/mL — ABNORMAL HIGH (ref 0.10–4.00)

## 2020-06-20 LAB — LDL CHOLESTEROL, DIRECT: Direct LDL: 94 mg/dL

## 2020-06-20 MED ORDER — KETOCONAZOLE 2 % EX SHAM: 1.0000 "application " | MEDICATED_SHAMPOO | CUTANEOUS | 0 refills | Status: DC

## 2020-06-20 MED ORDER — SILDENAFIL CITRATE 50 MG PO TABS: 50.0000 mg | ORAL_TABLET | Freq: Every day | ORAL | 0 refills | Status: DC | PRN

## 2020-06-20 NOTE — Assessment & Plan Note (Signed)
Check lipid panel.  Continue simvastatin 40 mg once daily.

## 2020-06-20 NOTE — Assessment & Plan Note (Signed)
Possible seborrheic dermatitis given improvement with Selsun Blue.  We will treat with ketoconazole shampoo to see if that resolves this.  If it does not I could consider topical steroid and/or dermatology referral.

## 2020-06-20 NOTE — Assessment & Plan Note (Signed)
Decent control today though has not been checking consistently at home.  He will check daily for the next 2 weeks and get [place] a list of his readings.  He will follow up in 1 month for BP check.  He will continue hydrochlorothiazide 25 mg once daily and lisinopril 20 mg once daily.

## 2020-06-20 NOTE — Progress Notes (Signed)
Tommi Rumps, MD Phone: [phone]  William Yang is a 64 y.o. male who presents today for f/u.  Scalp itching: This has been going on since July.  It occurs on his right scalp.  He did have it on the top of his scalp previously though he used [employer] and that seemed to help.  Possible flaking.  He has not changed any soaps or detergents.  No change in shampoo prior to the onset of this.  He has had small bumps that itch significantly.  Skin tag: This is on his right neck.  Its come up over the last month.  It has not enlarged.  It was sore that was not an issue now.  Erectile dysfunction: Patient does note issues getting an erection when he is trying to have intercourse with his partner.  He notes he does get erections at other times.  He wonders if it may be a psychological issue though also wonders if Viagra may be beneficial.  He notes no depression or anxiety.  He notes it has been sometime since he was last sexually active.  No pain with erections.  No cardiac issues.  HYPERTENSION  Disease Monitoring  Home BP Monitoring not checking consistently, though notes when he does check it is in the 140/70s range Chest pain- no    Dyspnea- no Medications  Compliance-  Taking HCTZ, lisinopril.   Edema- no  HYPERLIPIDEMIA Symptoms Chest pain on exertion:  no   Medications: Compliance- taking simvastatin Right upper quadrant pain- no  Muscle aches- no      Social History   Tobacco Use  Smoking Status Current Every Day Smoker  . Packs/day: 1.00  . Years: 45.00  . Pack years: 45.00  . Types: Cigarettes  Smokeless Tobacco Never Used     ROS see history of present illness  Objective  Physical Exam Vitals:   06/20/20 1026  BP: 130/70  Pulse: 64  Temp: 98.1 F (36.7 C)  SpO2: 99%    BP Readings from Last 3 Encounters:  06/20/20 130/70  08/06/19 135/86  05/04/19 115/70   Wt Readings from Last 3 Encounters:  06/20/20 178 lb 3.2 oz (80.8 kg)  08/06/19 171 lb  (77.6 kg)  05/04/19 166 lb (75.3 kg)    Physical Exam Constitutional:      General: He is not in acute distress.    Appearance: He is not diaphoretic.  HENT:     Head:   Cardiovascular:     Rate and Rhythm: Normal rate and regular rhythm.     Heart sounds: Normal heart sounds.  Pulmonary:     Effort: Pulmonary effort is normal.     Breath sounds: Normal breath sounds.  Musculoskeletal:     Right lower leg: No edema.     Left lower leg: No edema.  Skin:    General: Skin is warm and dry.  Neurological:     Mental Status: He is alert.      Assessment/Plan: Please see individual problem list.  Problem List Items Addressed This Visit    Elevated PSA    History of this in the past.  Check today.      Relevant Orders   PSA   Erectile dysfunction    The patient does likely have some psychological component to his erectile dysfunction though he does have risk factors for a organic cause.  We will trial Viagra 50 mg once daily as needed for erectile dysfunction to see if that provides any  benefit.  If not beneficial discussed the potential for therapy referral.      Relevant Medications   sildenafil (VIAGRA) 50 MG tablet   Essential hypertension    Decent control today though has not been checking consistently at home.  He will check daily for the next 2 weeks and get [place] a list of his readings.  He will follow up in 1 month for BP check.  He will continue hydrochlorothiazide 25 mg once daily and lisinopril 20 mg once daily.      Relevant Medications   sildenafil (VIAGRA) 50 MG tablet   Other Relevant Orders   Comp Met (CMET)   Hyperlipidemia    Check lipid panel.  Continue simvastatin 40 mg once daily.      Relevant Medications   sildenafil (VIAGRA) 50 MG tablet   Other Relevant Orders   Lipid panel   Comp Met (CMET)   Prediabetes    Check A1c.      Relevant Orders   HgB A1c   Seborrheic dermatitis    Possible seborrheic dermatitis given improvement with  Selsun Blue.  We will treat with ketoconazole shampoo to see if that resolves this.  If it does not I could consider topical steroid and/or dermatology referral.      Relevant Medications   ketoconazole (NIZORAL) 2 % shampoo   Skin tag    Benign-appearing.  He will let us know when he would like to see dermatology for this.         Health maintenance: Patient notes he got the [place] vaccine.  Discussed when he is able to get the booster he should get the [employer] booster.  Discussed that these have a little bit better efficacy.  The patient also asked about low-dose aspirin.  He does not take this though he wondered if he should.  I discussed the recent guidelines indicate that the benefits do not outweigh the risks in his age group for primary prevention.  Discussed that he did not need to start on this.  This visit occurred during the SARS-CoV-2 public health emergency.  Safety protocols were in place, including screening questions prior to the visit, additional usage of staff PPE, and extensive cleaning of exam room while observing appropriate contact time as indicated for disinfecting solutions.    Tommi Rumps, MD [place]

## 2020-06-20 NOTE — Assessment & Plan Note (Signed)
The patient does likely have some psychological component to his erectile dysfunction though he does have risk factors for a organic cause.  We will trial Viagra 50 mg once daily as needed for erectile dysfunction to see if that provides any benefit.  If not beneficial discussed the potential for therapy referral.

## 2020-06-20 NOTE — Assessment & Plan Note (Signed)
History of this in the past.  Check today.

## 2020-06-20 NOTE — Patient Instructions (Signed)
Nice to see you. We will try the ketoconazole shampoo for your scalp. Please try the Viagra and let me know if this is or is not beneficial. We will get labs today and contact you with the results. Please check your blood pressure daily for the next 2 weeks and let me know what the readings are.

## 2020-06-20 NOTE — Assessment & Plan Note (Signed)
Benign-appearing.  He will let us know when he would like to see dermatology for this.

## 2020-06-20 NOTE — Assessment & Plan Note (Signed)
Check A1c. 

## 2020-06-23 ENCOUNTER — Other Ambulatory Visit: Payer: Self-pay | Admitting: Family Medicine

## 2020-06-23 DIAGNOSIS — N529 Male erectile dysfunction, unspecified: Secondary | ICD-10-CM

## 2020-06-25 ENCOUNTER — Other Ambulatory Visit: Payer: Self-pay | Admitting: Family Medicine

## 2020-06-25 DIAGNOSIS — N529 Male erectile dysfunction, unspecified: Secondary | ICD-10-CM

## 2020-07-03 ENCOUNTER — Other Ambulatory Visit: Payer: Self-pay | Admitting: Family Medicine

## 2020-07-03 DIAGNOSIS — N529 Male erectile dysfunction, unspecified: Secondary | ICD-10-CM

## 2020-07-06 NOTE — Telephone Encounter (Signed)
Please follow-up with the patient to see if this was beneficial initially.  Thanks.

## 2020-07-07 NOTE — Telephone Encounter (Signed)
Patient did not request refill. He has yet to try the medication. Canceling request

## 2020-07-18 ENCOUNTER — Other Ambulatory Visit: Payer: Self-pay | Admitting: Family Medicine

## 2020-07-26 ENCOUNTER — Ambulatory Visit (INDEPENDENT_AMBULATORY_CARE_PROVIDER_SITE_OTHER): Payer: Medicare HMO | Admitting: Family Medicine

## 2020-07-26 ENCOUNTER — Encounter: Payer: Self-pay | Admitting: Family Medicine

## 2020-07-26 ENCOUNTER — Other Ambulatory Visit: Payer: Self-pay

## 2020-07-26 VITALS — BP 140/80 | HR 89 | Temp 98.4°F | Ht 66.0 in | Wt 180.0 lb

## 2020-07-26 DIAGNOSIS — R972 Elevated prostate specific antigen [PSA]: Secondary | ICD-10-CM | POA: Diagnosis not present

## 2020-07-26 DIAGNOSIS — N529 Male erectile dysfunction, unspecified: Secondary | ICD-10-CM | POA: Diagnosis not present

## 2020-07-26 DIAGNOSIS — I10 Essential (primary) hypertension: Secondary | ICD-10-CM | POA: Diagnosis not present

## 2020-07-26 MED ORDER — LISINOPRIL 40 MG PO TABS: 40.0000 mg | ORAL_TABLET | Freq: Every day | ORAL | 1 refills | Status: DC

## 2020-07-26 NOTE — Assessment & Plan Note (Signed)
Uncontrolled. We will increase his lisinopril to 40 mg daily. He will continue HCTZ. Check BMET in 10 days.

## 2020-07-26 NOTE — Assessment & Plan Note (Signed)
Mild elevated on recent check. Plan to recheck today. Patient declined urology appointment previously.

## 2020-07-26 NOTE — Progress Notes (Signed)
  Tommi Rumps, MD Phone: [phone]  William Yang is a 64 y.o. male who presents today for f/u.  HYPERTENSION  Disease Monitoring  Home BP Monitoring typically 120s-140s/70s-90s Chest pain- no    Dyspnea- no Medications  Compliance-  Taking lisinopril 20 mg daily, HCTZ 25 mg daily.  Edema- no  ED: viagra worked ok. Has only tried it once. Notes his partner has genital herpes and he has to wear a condom which bothers him. He spoke with a pharmacist that advised that there is medication that can decrease the risk of transmission, though he has not talked to his partner about this yet.     Social History   Tobacco Use  Smoking Status Current Every Day Smoker  . Packs/day: 1.00  . Years: 45.00  . Pack years: 45.00  . Types: Cigarettes  Smokeless Tobacco Never Used     ROS see history of present illness  Objective  Physical Exam Vitals:   07/26/20 0931  BP: 140/80  Pulse: 89  Temp: 98.4 F (36.9 C)  SpO2: 99%    BP Readings from Last 3 Encounters:  07/26/20 140/80  06/20/20 130/70  08/06/19 135/86   Wt Readings from Last 3 Encounters:  07/26/20 180 lb (81.6 kg)  06/20/20 178 lb 3.2 oz (80.8 kg)  08/06/19 171 lb (77.6 kg)    Physical Exam Constitutional:      General: He is not in acute distress.    Appearance: He is not diaphoretic.  Cardiovascular:     Rate and Rhythm: Normal rate and regular rhythm.     Heart sounds: Normal heart sounds.  Pulmonary:     Effort: Pulmonary effort is normal.     Breath sounds: Normal breath sounds.  Musculoskeletal:     Right lower leg: No edema.     Left lower leg: No edema.  Skin:    General: Skin is warm and dry.  Neurological:     Mental Status: He is alert.      Assessment/Plan: Please see individual problem list.  Problem List Items Addressed This Visit    Elevated PSA - Primary    Mild elevated on recent check. Plan to recheck today. Patient declined urology appointment previously.       Relevant  Orders   PSA   Erectile dysfunction    Can continue viagra. I encouraged him to continue to wear condoms. Discussed risk of symptoms if he contracted herpes.       Essential hypertension    Uncontrolled. We will increase his lisinopril to 40 mg daily. He will continue HCTZ. Check BMET in 10 days.       Relevant Medications   lisinopril (ZESTRIL) 40 MG tablet   Other Relevant Orders   Basic Metabolic Panel (BMET)      This visit occurred during the SARS-CoV-2 public health emergency.  Safety protocols were in place, including screening questions prior to the visit, additional usage of staff PPE, and extensive cleaning of exam room while observing appropriate contact time as indicated for disinfecting solutions.    Tommi Rumps, MD [place]

## 2020-07-26 NOTE — Patient Instructions (Signed)
Nice to see you. We will check labs in 10 days.  We are increasing your lisinopril to 40 mg daily.  Please continue to wear condoms when sexually active.

## 2020-07-26 NOTE — Assessment & Plan Note (Signed)
Can continue viagra. I encouraged him to continue to wear condoms. Discussed risk of symptoms if he contracted herpes.

## 2020-08-03 ENCOUNTER — Other Ambulatory Visit: Payer: Self-pay

## 2020-08-03 ENCOUNTER — Ambulatory Visit (INDEPENDENT_AMBULATORY_CARE_PROVIDER_SITE_OTHER): Payer: Medicare HMO

## 2020-08-03 DIAGNOSIS — I10 Essential (primary) hypertension: Secondary | ICD-10-CM | POA: Diagnosis not present

## 2020-08-03 DIAGNOSIS — R972 Elevated prostate specific antigen [PSA]: Secondary | ICD-10-CM | POA: Diagnosis not present

## 2020-08-03 LAB — BASIC METABOLIC PANEL
BUN: 16 mg/dL (ref 6–23)
CO2: 27 mEq/L (ref 19–32)
Calcium: 9.1 mg/dL (ref 8.4–10.5)
Chloride: 101 mEq/L (ref 96–112)
Creatinine, Ser: 1.07 mg/dL (ref 0.40–1.50)
GFR: 72.94 mL/min (ref >60.00)
Glucose, Bld: 84 mg/dL (ref 70–99)
Potassium: 4.8 mEq/L (ref 3.5–5.1)
Sodium: 135 mEq/L (ref 135–145)

## 2020-08-03 LAB — PSA: PSA: 3.35 ng/mL (ref 0.10–4.00)

## 2020-08-03 NOTE — Progress Notes (Signed)
Patient is here for a BP check due to bp being high at last visit, as per patient.  Currently patients BP is 135/80 and BPM is 73.  Patient has no complaints of headaches, blurry vision, chest pain, arm pain, light headedness, dizziness, and nor jaw pain. Please see previous note for order.

## 2020-08-05 ENCOUNTER — Inpatient Hospital Stay: Payer: Medicare HMO | Admitting: Oncology

## 2020-08-05 ENCOUNTER — Other Ambulatory Visit: Payer: Self-pay

## 2020-08-05 ENCOUNTER — Inpatient Hospital Stay: Payer: Medicare HMO | Attending: Oncology

## 2020-08-05 VITALS — BP 112/75 | HR 80 | Temp 98.0°F | Resp 16 | Wt 181.9 lb

## 2020-08-05 DIAGNOSIS — F17210 Nicotine dependence, cigarettes, uncomplicated: Secondary | ICD-10-CM | POA: Insufficient documentation

## 2020-08-05 DIAGNOSIS — I10 Essential (primary) hypertension: Secondary | ICD-10-CM | POA: Insufficient documentation

## 2020-08-05 DIAGNOSIS — Z8249 Family history of ischemic heart disease and other diseases of the circulatory system: Secondary | ICD-10-CM | POA: Insufficient documentation

## 2020-08-05 DIAGNOSIS — D729 Disorder of white blood cells, unspecified: Secondary | ICD-10-CM

## 2020-08-05 DIAGNOSIS — Z6372 Alcoholism and drug addiction in family: Secondary | ICD-10-CM | POA: Insufficient documentation

## 2020-08-05 DIAGNOSIS — E785 Hyperlipidemia, unspecified: Secondary | ICD-10-CM | POA: Insufficient documentation

## 2020-08-05 DIAGNOSIS — Z79899 Other long term (current) drug therapy: Secondary | ICD-10-CM | POA: Diagnosis not present

## 2020-08-05 DIAGNOSIS — D72829 Elevated white blood cell count, unspecified: Secondary | ICD-10-CM | POA: Diagnosis present

## 2020-08-05 DIAGNOSIS — Z801 Family history of malignant neoplasm of trachea, bronchus and lung: Secondary | ICD-10-CM | POA: Diagnosis not present

## 2020-08-05 LAB — CBC WITH DIFFERENTIAL/PLATELET
Abs Immature Granulocytes: 0.1 10*3/uL — ABNORMAL HIGH (ref 0.00–0.07)
Basophils Absolute: 0.1 10*3/uL (ref 0.0–0.1)
Basophils Relative: 1 %
Eosinophils Absolute: 0.2 10*3/uL (ref 0.0–0.5)
Eosinophils Relative: 2 %
HCT: 40.7 % (ref 39.0–52.0)
Hemoglobin: 14.2 g/dL (ref 13.0–17.0)
Immature Granulocytes: 1 %
Lymphocytes Relative: 22 %
Lymphs Abs: 2.8 10*3/uL (ref 0.7–4.0)
MCH: 31.6 pg (ref 26.0–34.0)
MCHC: 34.9 g/dL (ref 30.0–36.0)
MCV: 90.6 fL (ref 80.0–100.0)
Monocytes Absolute: 0.8 10*3/uL (ref 0.1–1.0)
Monocytes Relative: 7 %
Neutro Abs: 8.6 10*3/uL — ABNORMAL HIGH (ref 1.7–7.7)
Neutrophils Relative %: 67 %
Platelets: 304 10*3/uL (ref 150–400)
RBC: 4.49 MIL/uL (ref 4.22–5.81)
RDW: 12.6 % (ref 11.5–15.5)
WBC: 12.6 10*3/uL — ABNORMAL HIGH (ref 4.0–10.5)
nRBC: 0 % (ref 0.0–0.2)

## 2020-08-07 ENCOUNTER — Encounter: Payer: Self-pay | Admitting: Oncology

## 2020-08-07 NOTE — Progress Notes (Signed)
Hematology/Oncology Consult note [facility]  Telephone:([phone] [phone]  Patient Care Team: Leone Haven, MD as PCP - General (Family Medicine) Sindy Guadeloupe, MD as Consulting Physician (Oncology)   Name of the patient: William Yang  [id number]  13-Jan-1956   Date of visit: 08/07/20  Diagnosis- leukocytosis likely reactive secondary to smoking   Chief complaint/ Reason for visit-routine follow-up of leukocytosis  Heme/Onc history: patient is a 64 year old male who has been referred to [place] for leukocytosis. His past medical history significant for hypertension hyperlipidemia. He is also a current everyday smoker and he smokes about 1 pack/day and has done so for over 30 years. He has tried to quit intermittently in the past but has been unsuccessful so far. He also admits to drinking alcohol on a regular basis. Most recent CBC from 06/25/2018 showed white count of 14.3, H&H of 14.7/42.8 with an MCV of 93.5 and a platelet count of 291. Differential mainly showed neutrophilia with an ANC of 8.8 with some degree of monocytosis and basophilia. There are some other condoms available since May 2019 when his white count fluctuates between 10-14 but I do not have any counts before May 2019 for comparison.  Results of blood work from 07/10/2018 were as follows: CBC showed white count of 11.4, H&H of 13.9/41.1 with a platelet count of 275. Differential mainly showed monocytosis with an absolute monocyte count of 1.1. BCR able fish testing was negative. Peripheral flow cytometry testing did not reveal any evidence of immunophenotypic abnormality.Smear review showed unremarkable morphology  Interval history-patient continues to smoke and is hopeful that he would be able to quit again.  He is planning to move to [place] to be closer to his daughter.  Denies any recurrent infections or hospitalizations.  Denies any cough shortness of breath or  unintentional weight loss  ECOG PS- 0 Pain scale- 0   Review of systems- Review of Systems  Constitutional: Negative for chills, fever, malaise/fatigue and weight loss.  HENT: Negative for congestion, ear discharge and nosebleeds.   Eyes: Negative for blurred vision.  Respiratory: Negative for cough, hemoptysis, sputum production, shortness of breath and wheezing.   Cardiovascular: Negative for chest pain, palpitations, orthopnea and claudication.  Gastrointestinal: Negative for abdominal pain, blood in stool, constipation, diarrhea, heartburn, melena, nausea and vomiting.  Genitourinary: Negative for dysuria, flank pain, frequency, hematuria and urgency.  Musculoskeletal: Negative for back pain, joint pain and myalgias.  Skin: Negative for rash.  Neurological: Negative for dizziness, tingling, focal weakness, seizures, weakness and headaches.  Endo/Heme/Allergies: Does not bruise/bleed easily.  Psychiatric/Behavioral: Negative for depression and suicidal ideas. The patient does not have insomnia.       No Known Allergies   Past Medical History:  Diagnosis Date   Chickenpox    Hyperlipidemia    Hypertension      Past Surgical History:  Procedure Laterality Date   APPENDECTOMY  1964    Social History   Socioeconomic History   Marital status: Married    Spouse name: Not on file   Number of children: Not on file   Years of education: Not on file   Highest education level: Not on file  Occupational History   Not on file  Tobacco Use   Smoking status: Current Every Day Smoker    Packs/day: 1.00    Years: 45.00    Pack years: 45.00    Types: Cigarettes   Smokeless tobacco: Never Used  Media planner  Vaping Use: Never used  Substance and Sexual Activity   Alcohol use: Yes    Alcohol/week: 21.0 standard drinks    Types: 21 Standard drinks or equivalent per week    Comment: 21 beers a week    Drug use: No   Sexual activity: Not on file  Other Topics  Concern   Not on file  Social History Narrative   Not on file   Social Determinants of Health   Financial Resource Strain: Not on file  Food Insecurity: Not on file  Transportation Needs: Not on file  Physical Activity: Not on file  Stress: Not on file  Social Connections: Not on file  Intimate Partner Violence: Not on file    Family History  Problem Relation Age of Onset   Alcoholism Unknown        Parent   Lung cancer Brother    Hypertension Unknown        Parent     Current Outpatient Medications:    hydrochlorothiazide (HYDRODIURIL) 25 MG tablet, TAKE ONE TABLET BY MOUTH DAILY, Disp: 30 tablet, Rfl: 0   ketoconazole (NIZORAL) 2 % shampoo, Apply 1 application topically 2 (two) times a week., Disp: 120 mL, Rfl: 0   lisinopril (ZESTRIL) 40 MG tablet, Take 1 tablet (40 mg total) by mouth daily., Disp: 90 tablet, Rfl: 1   simvastatin (ZOCOR) 40 MG tablet, TAKE ONE TABLET BY MOUTH DAILY, Disp: 90 tablet, Rfl: 0   sildenafil (VIAGRA) 50 MG tablet, TAKE ONE TABLET BY MOUTH DAILY AS NEEDED FOR ERECTILE DYSFUNCTION (Patient not taking: Reported on 08/05/2020), Disp: 5 tablet, Rfl: 0  Physical exam:  Vitals:   08/05/20 1419  BP: 112/75  Pulse: 80  Resp: 16  Temp: 98 F (36.7 C)  TempSrc: Tympanic  SpO2: 99%  Weight: 181 lb 14.4 oz (82.5 kg)   Physical Exam Constitutional:      General: He is not in acute distress. Eyes:     Extraocular Movements: EOM normal.     Pupils: Pupils are equal, round, and reactive to light.  Cardiovascular:     Rate and Rhythm: Normal rate and regular rhythm.     Heart sounds: Normal heart sounds.  Pulmonary:     Effort: Pulmonary effort is normal.     Breath sounds: Normal breath sounds.  Abdominal:     General: Bowel sounds are normal.     Palpations: Abdomen is soft.  Skin:    General: Skin is warm and dry.  Neurological:     Mental Status: He is alert and oriented to person, place, and time.      CMP Latest Ref Rng &  Units 08/03/2020  Glucose 70 - 99 mg/dL 84  BUN 6 - 23 mg/dL 16  Creatinine 0.40 - 1.50 mg/dL 1.07  Sodium 135 - 145 mEq/L 135  Potassium 3.5 - 5.1 mEq/L 4.8  Chloride 96 - 112 mEq/L 101  CO2 19 - 32 mEq/L 27  Calcium 8.4 - 10.5 mg/dL 9.1  Total Protein 6.0 - 8.3 g/dL -  Total Bilirubin 0.2 - 1.2 mg/dL -  Alkaline Phos 39 - 117 U/L -  AST 0 - 37 U/L -  ALT 0 - 53 U/L -   CBC Latest Ref Rng & Units 08/05/2020  WBC 4.0 - 10.5 K/uL 12.6(H)  Hemoglobin 13.0 - 17.0 g/dL 14.2  Hematocrit 39.0 - 52.0 % 40.7  Platelets 150 - 400 K/uL 304     Assessment and plan- Patient is a  64 y.o. male with chronic leukocytosis/neutrophilia likely reactive secondary to smoking  Patient has chronic leukocytosis/neutrophilia with a white cell count fluctuates between 10-14.  Differential mainly shows neutrophilia with occasional monocytosis.  This has remained stable over the last 2 years peripheral flow cytometry was also unremarkable.  Suspect this is secondary to smoking and does not require any intervention at this time.  He does not require any hematology follow-up at this time and can continue to follow-up with Dr. Caryl Bis.  He can be referred to [place] in the future if questions or concerns arise  Patient would meet criteria for yearly low-dose lung cancer screening protocol and I will get in touch with Melinda Crutch about this   Visit Diagnosis 1. Neutrophilia      Dr. Randa Evens, MD, MPH [facility] [phone] 08/07/2020 12:39 PM

## 2020-08-16 ENCOUNTER — Other Ambulatory Visit: Payer: Self-pay | Admitting: Family Medicine

## 2020-08-18 ENCOUNTER — Telehealth: Payer: Self-pay | Admitting: *Deleted

## 2020-08-18 DIAGNOSIS — Z122 Encounter for screening for malignant neoplasm of respiratory organs: Secondary | ICD-10-CM

## 2020-08-18 DIAGNOSIS — Z87891 Personal history of nicotine dependence: Secondary | ICD-10-CM

## 2020-08-18 NOTE — Telephone Encounter (Signed)
Received referral for initial lung cancer screening scan. Contacted patient and obtained smoking history,(current, 45 pack year) as well as answering questions related to screening process. Patient denies signs of lung cancer such as weight loss or hemoptysis. Patient denies comorbidity that would prevent curative treatment if lung cancer were found. Patient is scheduled for shared decision making visit and CT scan on 09/13/20 at 130pm.

## 2020-08-20 ENCOUNTER — Other Ambulatory Visit: Payer: Self-pay | Admitting: Family Medicine

## 2020-09-13 ENCOUNTER — Ambulatory Visit
Admission: RE | Admit: 2020-09-13 | Discharge: 2020-09-13 | Disposition: A | Discharge status: Home / Self Care | Payer: Medicare HMO | Source: Ambulatory Visit | Attending: Nurse Practitioner | Admitting: Nurse Practitioner

## 2020-09-13 ENCOUNTER — Inpatient Hospital Stay: Payer: Medicare HMO | Attending: Nurse Practitioner | Admitting: Nurse Practitioner

## 2020-09-13 ENCOUNTER — Other Ambulatory Visit: Payer: Self-pay

## 2020-09-13 DIAGNOSIS — Z87891 Personal history of nicotine dependence: Secondary | ICD-10-CM

## 2020-09-13 DIAGNOSIS — R69 Illness, unspecified: Secondary | ICD-10-CM | POA: Diagnosis not present

## 2020-09-13 DIAGNOSIS — Z122 Encounter for screening for malignant neoplasm of respiratory organs: Secondary | ICD-10-CM

## 2020-09-13 NOTE — Progress Notes (Signed)
Virtual Visit via Video Enabled Telemedicine Note   I connected with William Yang on 09/13/20 at 1:30 PM EST by video enabled telemedicine visit and verified that I am speaking with the correct person using two identifiers.   I discussed the limitations, risks, security and privacy concerns of performing an evaluation and management service by telemedicine and the availability of in-person appointments. I also discussed with the patient that there may be a patient responsible charge related to this service. The patient expressed understanding and agreed to proceed.   Other persons participating in the visit and their role in the encounter: Burgess Estelle, RN- checking in patient & navigation  Patient's location: [place]  Provider's location: home  Chief Complaint: Low Dose CT Screening  Patient agreed to evaluation by telemedicine to discuss shared decision making for consideration of low dose CT lung cancer screening.    In accordance with CMS guidelines, patient has met eligibility criteria including age, absence of signs or symptoms of lung cancer.  Social History   Tobacco Use  . Smoking status: Current Every Day Smoker    Packs/day: 1.00    Years: 45.00    Pack years: 45.00    Types: Cigarettes  . Smokeless tobacco: Never Used  Substance Use Topics  . Alcohol use: Yes    Alcohol/week: 21.0 standard drinks    Types: 21 Standard drinks or equivalent per week    Comment: 21 beers a week      A shared decision-making session was conducted prior to the performance of CT scan. This includes one or more decision aids, includes benefits and harms of screening, follow-up diagnostic testing, over-diagnosis, false positive rate, and total radiation exposure.   Counseling on the importance of adherence to annual lung cancer LDCT screening, impact of co-morbidities, and ability or willingness to undergo diagnosis and treatment is imperative for compliance of the program.    Counseling on the importance of continued smoking cessation for former smokers; the importance of smoking cessation for current smokers, and information about tobacco cessation interventions have been given to patient including [place] and [employer] [place] programs.   Written order for lung cancer screening with LDCT has been given to the patient and any and all questions have been answered to the best of my abilities.    Yearly follow up will be coordinated by Burgess Estelle, Thoracic Navigator.  I discussed the assessment and treatment plan with the patient. The patient was provided an opportunity to ask questions and all were answered. The patient agreed with the plan and demonstrated an understanding of the instructions.   The patient was advised to call back or seek an in-person evaluation if the symptoms worsen or if the condition fails to improve as anticipated.   I provided 15 minutes of face-to-face video visit time dedicated to the care of this patient on the date of this encounter to include pre-visit review of smoking history, face-to-face time with the patient, and post visit ordering of testing/documentation.   Beckey Rutter, DNP, AGNP-C [facility] [phone] (clinic)

## 2020-09-14 ENCOUNTER — Telehealth: Payer: Self-pay | Admitting: *Deleted

## 2020-09-14 NOTE — Telephone Encounter (Signed)
Attempted to contact patient to review lung screening results and recommendations. However, there is no answer or voicemail option. Will try again later date

## 2020-09-15 ENCOUNTER — Telehealth: Payer: Self-pay | Admitting: *Deleted

## 2020-09-15 NOTE — Telephone Encounter (Signed)
Can you add him for tumor board?

## 2020-09-15 NOTE — Telephone Encounter (Signed)
Notified patient of LDCT lung cancer screening program results with recommendation for 3 month follow up imaging. Also notified of incidental findings noted below and is encouraged to discuss further with PCP who will receive a copy of this note and/or the CT report. Patient verbalizes understanding.   IMPRESSION: 1. Lung-RADS 4A, suspicious. Follow up low-dose chest CT without contrast in 3 months (please use the following order, "CT CHEST LCS NODULE FOLLOW-UP W/O CM") is recommended. Alternatively, PET may be considered when there is a solid component 47mm or larger. 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 Is technically indeterminate. As it is plain film visible, recommend correlation with prior plain films to confirm stability. If none are available, then above recommendations apply. If remote plain films can confirm size stability, patient can return to screening population in 1 year. 2. Aortic atherosclerosis (ICD10-I70.0), coronary artery atherosclerosis and emphysema (ICD10-J43.9). 3. Left-sided partially calcified pleural plaques, possibly related to prior empyema or hemothorax. Given subtle right-sided pleural irregularity, asbestos related pleural disease is a consideration.

## 2020-09-16 ENCOUNTER — Other Ambulatory Visit: Payer: Self-pay | Admitting: *Deleted

## 2020-09-21 ENCOUNTER — Encounter: Payer: Self-pay | Admitting: Licensed Clinical Social Worker

## 2020-09-22 ENCOUNTER — Inpatient Hospital Stay: Payer: Medicare HMO | Attending: Oncology

## 2020-09-27 ENCOUNTER — Other Ambulatory Visit: Payer: Self-pay | Admitting: Family Medicine

## 2020-10-20 ENCOUNTER — Other Ambulatory Visit: Payer: Self-pay

## 2020-10-24 ENCOUNTER — Ambulatory Visit (INDEPENDENT_AMBULATORY_CARE_PROVIDER_SITE_OTHER): Payer: Medicare HMO | Admitting: Family Medicine

## 2020-10-24 ENCOUNTER — Encounter: Payer: Self-pay | Admitting: Family Medicine

## 2020-10-24 ENCOUNTER — Other Ambulatory Visit: Payer: Self-pay

## 2020-10-24 DIAGNOSIS — R911 Solitary pulmonary nodule: Secondary | ICD-10-CM | POA: Diagnosis not present

## 2020-10-24 DIAGNOSIS — I10 Essential (primary) hypertension: Secondary | ICD-10-CM | POA: Diagnosis not present

## 2020-10-24 DIAGNOSIS — R69 Illness, unspecified: Secondary | ICD-10-CM | POA: Diagnosis not present

## 2020-10-24 DIAGNOSIS — F17210 Nicotine dependence, cigarettes, uncomplicated: Secondary | ICD-10-CM | POA: Insufficient documentation

## 2020-10-24 DIAGNOSIS — F1010 Alcohol abuse, uncomplicated: Secondary | ICD-10-CM

## 2020-10-24 DIAGNOSIS — E785 Hyperlipidemia, unspecified: Secondary | ICD-10-CM

## 2020-10-24 NOTE — Assessment & Plan Note (Addendum)
Smoking cessation counseling was provided.  Approximately 4 minutes were spent discussing the rationale for tobacco cessation and strategies for doing so.  Adjuncts, including nicotine patches, nicotine lozenges, varenicline and buproprion were recommended.  Patient will follow up in 4 months for this.  I encouraged him to let us know when he is ready to quit smoking if he would like assistance from [place].

## 2020-10-24 NOTE — Assessment & Plan Note (Signed)
Adequately controlled.  He will continue lisinopril 40 mg once daily and HCTZ 25 mg daily.  Follow-up in 4 months.

## 2020-10-24 NOTE — Assessment & Plan Note (Signed)
The patient will continue simvastatin 40 mg once daily.

## 2020-10-24 NOTE — Assessment & Plan Note (Signed)
He will continue to have lung cancer screening through the cancer center.  I advised that if they do not contact him sometime in April to schedule his follow-up CT scan he should contact the cancer center.

## 2020-10-24 NOTE — Patient Instructions (Signed)
Nice to see you. If they do not contact you to schedule your follow-up for your lung nodule please contact the cancer center. Please continue with your lisinopril and HCTZ. Please let us know if you would like help quitting smoking. Please decrease your alcohol intake.

## 2020-10-24 NOTE — Assessment & Plan Note (Signed)
I continue to encourage the patient to decrease his alcohol intake.  Patient is aware of long-term risks with excessive alcohol intake.  Discussed that he should not be taking in more than 3 alcoholic beverages at a time due to risk associated with binge drinking.

## 2020-10-24 NOTE — Progress Notes (Signed)
Tommi Rumps, MD Phone: [phone]  William Yang is a 65 y.o. male who presents today for f/u.  HYPERTENSION  Disease Monitoring  Home BP Monitoring 127/73 last night Chest pain- no    Dyspnea- no Medications  Compliance-  Taking lisinopril 40 mg daily, HCTZ 25 mg daily.  Edema- no  HYPERLIPIDEMIA Symptoms Chest pain on exertion:  no   Medications: Compliance- taking simvastatin 40 mg daily Right upper quadrant pain- no  Muscle aches- no  Tobacco abuse: Patient is smoking on a daily basis.  He is contemplating quitting but is not quite ready.  In the past he quit cold [place] for about a year.  He also used Chantix in the past though at that time he was not ready to quit and got down to 4 to 5 cigarettes a day.  Lung nodule: Patient has undergone CT chest lung cancer screening that revealed a lung nodule.  The patient reports the plan is for 49-month follow-up on this.  There were also findings of prior asbestos exposure.  Excessive alcohol intake:  Patient notes some days he does not drink and although other days he will have up to 9 beers.  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    Social History   Tobacco Use  Smoking Status Current Every Day Smoker  . Packs/day: 1.00  . Years: 45.00  . Pack years: 45.00  . Types: Cigarettes  Smokeless Tobacco Never Used    Current Outpatient Medications on File Prior to Visit  Medication Sig Dispense Refill  . hydrochlorothiazide (HYDRODIURIL) 25 MG tablet TAKE ONE TABLET BY MOUTH DAILY 30 tablet 0  . ketoconazole (NIZORAL) 2 % shampoo Apply 1 application topically 2 (two) times a week. 120 mL 0  . lisinopril (ZESTRIL) 40 MG tablet Take 1 tablet (40 mg total) by mouth daily. 90 tablet 1  . sildenafil (VIAGRA) 50 MG tablet TAKE ONE TABLET BY MOUTH DAILY AS NEEDED FOR ERECTILE DYSFUNCTION 5 tablet 0  . simvastatin (ZOCOR) 40 MG tablet TAKE ONE TABLET BY MOUTH DAILY 90 tablet 0   No  current facility-administered medications on file prior to visit.     ROS see history of present illness  Objective  Physical Exam Vitals:   10/24/20 0930  BP: 120/80  Pulse: 79  Temp: 97.9 F (36.6 C)  SpO2: 98%    BP Readings from Last 3 Encounters:  10/24/20 120/80  08/05/20 112/75  08/03/20 135/80   Wt Readings from Last 3 Encounters:  10/24/20 183 lb 6.4 oz (83.2 kg)  09/13/20 185 lb (83.9 kg)  08/05/20 181 lb 14.4 oz (82.5 kg)    Physical Exam Constitutional:      General: He is not in acute distress.    Appearance: He is not diaphoretic.  Cardiovascular:     Rate and Rhythm: Normal rate and regular rhythm.     Heart sounds: Normal heart sounds.  Pulmonary:     Effort: Pulmonary effort is normal.     Breath sounds: Normal breath sounds.  Musculoskeletal:        General: No edema.     Right lower leg: No edema.     Left lower leg: No edema.  Skin:    General: Skin is warm and dry.  Neurological:     Mental Status: He is alert.      Assessment/Plan: Please see individual problem list.  Problem List Items Addressed This Visit  Alcohol abuse    I continue to encourage the patient to decrease his alcohol intake.  Patient is aware of long-term risks with excessive alcohol intake.  Discussed that he should not be taking in more than 3 alcoholic beverages at a time due to risk associated with binge drinking.      Essential hypertension    Adequately controlled.  He will continue lisinopril 40 mg once daily and HCTZ 25 mg daily.  Follow-up in 4 months.      Hyperlipidemia    The patient will continue simvastatin 40 mg once daily.      Lung nodule    He will continue to have lung cancer screening through the cancer center.  I advised that if they do not contact him sometime in April to schedule his follow-up CT scan he should contact the cancer center.      Nicotine dependence, cigarettes, uncomplicated    Smoking cessation counseling was provided.   Approximately 4 minutes were spent discussing the rationale for tobacco cessation and strategies for doing so.  Adjuncts, including nicotine patches, nicotine lozenges, varenicline and buproprion were recommended.  Patient will follow up in 4 months for this.  I encouraged him to let us know when he is ready to quit smoking if he would like assistance from [place].          This visit occurred during the SARS-CoV-2 public health emergency.  Safety protocols were in place, including screening questions prior to the visit, additional usage of staff PPE, and extensive cleaning of exam room while observing appropriate contact time as indicated for disinfecting solutions.    Tommi Rumps, MD [place]

## 2020-10-26 ENCOUNTER — Other Ambulatory Visit: Payer: Self-pay | Admitting: Family Medicine

## 2020-11-10 ENCOUNTER — Other Ambulatory Visit: Payer: Self-pay | Admitting: Family Medicine

## 2020-12-07 ENCOUNTER — Telehealth: Payer: Self-pay | Admitting: *Deleted

## 2020-12-07 DIAGNOSIS — Z87891 Personal history of nicotine dependence: Secondary | ICD-10-CM

## 2020-12-07 DIAGNOSIS — R918 Other nonspecific abnormal finding of lung field: Secondary | ICD-10-CM

## 2020-12-07 NOTE — Telephone Encounter (Signed)
Spoke to patient via telephone re: scheduling his 3 month follow up lung screening ct scan. Current smoker, 1 ppd x 45 yrs. Scan scheduled for 01/04/21 @ 11:15 am. Patient is requesting a text message with the appointment details a few days prior to the scan @ [phone].

## 2021-01-04 ENCOUNTER — Other Ambulatory Visit: Payer: Self-pay

## 2021-01-04 ENCOUNTER — Ambulatory Visit
Admission: RE | Admit: 2021-01-04 | Discharge: 2021-01-04 | Disposition: A | Discharge status: Home / Self Care | Payer: Medicare HMO | Source: Ambulatory Visit | Attending: Oncology | Admitting: Oncology

## 2021-01-04 DIAGNOSIS — Z87891 Personal history of nicotine dependence: Secondary | ICD-10-CM

## 2021-01-04 DIAGNOSIS — R918 Other nonspecific abnormal finding of lung field: Secondary | ICD-10-CM

## 2021-01-04 DIAGNOSIS — J432 Centrilobular emphysema: Secondary | ICD-10-CM | POA: Diagnosis not present

## 2021-01-04 DIAGNOSIS — S2241XA Multiple fractures of ribs, right side, initial encounter for closed fracture: Secondary | ICD-10-CM | POA: Diagnosis not present

## 2021-01-04 DIAGNOSIS — I2510 Atherosclerotic heart disease of native coronary artery without angina pectoris: Secondary | ICD-10-CM | POA: Diagnosis not present

## 2021-01-04 DIAGNOSIS — J929 Pleural plaque without asbestos: Secondary | ICD-10-CM | POA: Diagnosis not present

## 2021-01-10 ENCOUNTER — Telehealth: Payer: Self-pay | Admitting: *Deleted

## 2021-01-10 DIAGNOSIS — J929 Pleural plaque without asbestos: Secondary | ICD-10-CM

## 2021-01-10 NOTE — Telephone Encounter (Signed)
Please let the patient know that his CT scan revealed pleural plaques suspicious for asbestos related disease.  Has he had any asbestos exposure in the past?  It would be a good idea to have him see pulmonology to get their input to see if any further work-up or evaluation needs to be completed for this.

## 2021-01-10 NOTE — Telephone Encounter (Signed)
I called and spoke with the patient and informed him of his CT scan results and he understood.  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 Patient would l ike a referral to pulmonary.  Alya Smaltz,cma

## 2021-01-10 NOTE — Addendum Note (Signed)
Addended by: Leone Haven on: 01/10/2021 04:03 PM   Modules accepted: Orders

## 2021-01-10 NOTE — Telephone Encounter (Signed)
Notified patient of LDCT lung cancer screening program results with recommendation for 12 month follow up imaging. Also notified of incidental findings noted below and is encouraged to discuss further with PCP who will receive a copy of this note and/or the CT report. Patient verbalizes understanding.   IMPRESSION: 1. Lung-RADS 2, benign appearance or behavior. Continue annual screening with low-dose chest CT without contrast in 12 months. The right middle lobe nodule of interest is similar and most likely represents a hamartoma. 2. Aortic Atherosclerosis (ICD10-I70.0) and Emphysema (ICD10-J43.9). Coronary artery atherosclerosis. 3. Bilateral pleural plaques with left-sided pleural calcification. Suspicious for asbestos related pleural disease.

## 2021-01-10 NOTE — Telephone Encounter (Signed)
Referral placed.

## 2021-01-18 ENCOUNTER — Telehealth: Payer: Self-pay | Admitting: Family Medicine

## 2021-01-18 NOTE — Telephone Encounter (Signed)
FYI

## 2021-01-18 NOTE — Telephone Encounter (Signed)
Noted  

## 2021-01-18 NOTE — Telephone Encounter (Signed)
Pt scheduled an appt for 01/19/21 Pt stated that he has a tick bite on his stomach, when he pulled it off he noticed it had a white star on its back

## 2021-01-19 ENCOUNTER — Ambulatory Visit (INDEPENDENT_AMBULATORY_CARE_PROVIDER_SITE_OTHER): Payer: Medicare HMO | Admitting: Adult Health

## 2021-01-19 ENCOUNTER — Other Ambulatory Visit: Payer: Self-pay

## 2021-01-19 ENCOUNTER — Encounter: Payer: Self-pay | Admitting: Adult Health

## 2021-01-19 VITALS — BP 126/74 | HR 64 | Temp 98.1°F | Ht 65.98 in | Wt 180.8 lb

## 2021-01-19 DIAGNOSIS — S30861A Insect bite (nonvenomous) of abdominal wall, initial encounter: Secondary | ICD-10-CM | POA: Diagnosis not present

## 2021-01-19 DIAGNOSIS — W57XXXA Bitten or stung by nonvenomous insect and other nonvenomous arthropods, initial encounter: Secondary | ICD-10-CM

## 2021-01-19 MED ORDER — DOXYCYCLINE HYCLATE 100 MG PO TABS: 200.0000 mg | ORAL_TABLET | Freq: Once | ORAL | 0 refills | Status: AC

## 2021-01-19 NOTE — Progress Notes (Signed)
Acute Office Visit  Subjective:    Patient ID: William Yang, male    DOB: 28-Jan-1956, 65 y.o.   MRN: [id number]  Chief Complaint  Patient presents with  . Tick Removal    Patient was bit by a tick. Pt has no symptoms.    HPI Patient is in today for tick bite on abdomen, he removed the tick 01/17/21 he feels it was on less than 48 hours.  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   Denies any headache, neck pain, or abdominal pain.   Patient  denies any fever, body aches,chills, rash, chest pain, shortness of breath, nausea, vomiting, or diarrhea.  Denies dizziness, lightheadedness, pre syncopal or syncopal episodes.      Past Medical History:  Diagnosis Date  . Chickenpox   . Hyperlipidemia   . Hypertension     Past Surgical History:  Procedure Laterality Date  . APPENDECTOMY  1964    Family History  Problem Relation Age of Onset  . Alcoholism Other        Parent  . Lung cancer Brother   . Hypertension Other        Parent    Social History   Socioeconomic History  . Marital status: Married    Spouse name: Not on file  . Number of children: Not on file  . Years of education: Not on file  . Highest education level: Not on file  Occupational History  . Not on file  Tobacco Use  . Smoking status: Current Every Day Smoker    Packs/day: 1.00    Years: 45.00    Pack years: 45.00    Types: Cigarettes  . Smokeless tobacco: Never Used  Vaping Use  . Vaping Use: Never used  Substance and Sexual Activity  . Alcohol use: Yes    Alcohol/week: 21.0 standard drinks    Types: 21 Standard drinks or equivalent per week    Comment: 21 beers a week   . Drug use: No  . Sexual activity: Not on file  Other Topics Concern  . Not on file  Social History Narrative  . Not on file   Social Determinants of Health   Financial Resource Strain: Not on file  Food Insecurity: Not on file  Transportation Needs: Not  on file  Physical Activity: Not on file  Stress: Not on file  Social Connections: Not on file  Intimate Partner Violence: Not on file    Outpatient Medications Prior to Visit  Medication Sig Dispense Refill  . hydrochlorothiazide (HYDRODIURIL) 25 MG tablet TAKE ONE TABLET BY MOUTH DAILY 30 tablet 4  . lisinopril (ZESTRIL) 40 MG tablet Take 1 tablet (40 mg total) by mouth daily. 90 tablet 1  . sildenafil (VIAGRA) 50 MG tablet TAKE ONE TABLET BY MOUTH DAILY AS NEEDED FOR ERECTILE DYSFUNCTION 5 tablet 0  . simvastatin (ZOCOR) 40 MG tablet TAKE ONE TABLET BY MOUTH DAILY 90 tablet 0  . ketoconazole (NIZORAL) 2 % shampoo Apply 1 application topically 2 (two) times a week. (Patient not taking: Reported on 01/19/2021) 120 mL 0   No facility-administered medications prior to visit.    No Known Allergies  Review of Systems  Constitutional: Negative.   HENT: Negative.   Respiratory: Negative.   Cardiovascular: Negative.   Genitourinary: Negative.   Musculoskeletal: Negative.   Skin: Positive for color change. Negative for pallor, rash and wound.  Neurological: Negative.  Psychiatric/Behavioral: Negative.        Objective:    Physical Exam Constitutional:      General: He is not in acute distress.    Appearance: He is not ill-appearing, toxic-appearing or diaphoretic.  HENT:     Head: Normocephalic and atraumatic.     Right Ear: External ear normal.     Left Ear: External ear normal.     Nose: Nose normal.     Mouth/Throat:     Mouth: Mucous membranes are moist.  Eyes:     Conjunctiva/sclera: Conjunctivae normal.  Cardiovascular:     Rate and Rhythm: Normal rate and regular rhythm.     Heart sounds: Normal heart sounds.  Pulmonary:     Breath sounds: Normal breath sounds.  Abdominal:     Palpations: Abdomen is soft.  Musculoskeletal:        General: Normal range of motion.     Cervical back: Full passive range of motion without pain, normal range of motion and neck supple.   Skin:    General: Skin is warm.     Findings: Erythema (pinpoint area at tick bite removal site. healing well. ) present.       Neurological:     Mental Status: He is alert and oriented to person, place, and time.  Psychiatric:        Mood and Affect: Mood normal.        Behavior: Behavior normal.        Thought Content: Thought content normal.        Judgment: Judgment normal.     BP 126/74   Pulse 64   Temp 98.1 F (36.7 C)   Ht 5' 5.98" (1.676 m)   Wt 180 lb 12.8 oz (82 kg)   SpO2 98%   BMI 29.20 kg/m  Wt Readings from Last 3 Encounters:  01/19/21 180 lb 12.8 oz (82 kg)  10/24/20 183 lb 6.4 oz (83.2 kg)  09/13/20 185 lb (83.9 kg)    Health Maintenance Due  Topic Date Due  . COVID-19 Vaccine (2 - Janssen risk 3-dose series) 12/24/2019  . PNA vac Low Risk Adult (1 of 2 - PCV13) 04/20/2020  . Fecal DNA (Cologuard)  10/30/2020    There are no preventive care reminders to display for this patient.   Lab Results  Component Value Date   TSH 0.22 (A) 09/19/2017   Lab Results  Component Value Date   WBC 12.6 (H) 08/05/2020   HGB 14.2 08/05/2020   HCT 40.7 08/05/2020   MCV 90.6 08/05/2020   PLT 304 08/05/2020   Lab Results  Component Value Date   NA 135 08/03/2020   K 4.8 08/03/2020   CO2 27 08/03/2020   GLUCOSE 84 08/03/2020   BUN 16 08/03/2020   CREATININE 1.07 08/03/2020   BILITOT 0.5 06/20/2020   ALKPHOS 70 06/20/2020   AST 15 06/20/2020   ALT 17 06/20/2020   PROT 6.8 06/20/2020   ALBUMIN 4.4 06/20/2020   CALCIUM 9.1 08/03/2020   GFR 72.94 08/03/2020   Lab Results  Component Value Date   CHOL 170 06/20/2020   Lab Results  Component Value Date   HDL 53.80 06/20/2020   Lab Results  Component Value Date   LDLCALC 67 06/25/2018   Lab Results  Component Value Date   TRIG 205.0 (H) 06/20/2020   Lab Results  Component Value Date   CHOLHDL 3 06/20/2020   Lab Results  Component Value Date   HGBA1C  6.1 06/20/2020       Assessment &  Plan:   Problem List Items Addressed This Visit      Musculoskeletal and Integument   Tick bite of abdominal wall - Primary   Relevant Medications   doxycycline (VIBRA-TABS) 100 MG tablet      Will give prophylactic dosage of Doxycycline, discussed side effects, sun sensitivity and not lying down within 1 hour of taking medication.  Return to office if any symptoms occur. Given information in After visit Summary  of tick borne illness as discussed at office visit.  Tick was likely in place less than 24 hours and does decrease likelihood of tick borne illness, however he is advised to monitor.  Erythematous migrans rash is discussed to monitor for.   Lyme antibodies would not be recommended since patient is asymptomatic and does not usually show for 4- 6 weeks. Patient verbalized understanding of all instructions given and denies any further questions at this time.   Meds ordered this encounter  Medications  . doxycycline (VIBRA-TABS) 100 MG tablet    Sig: Take 2 tablets (200 mg total) by mouth once for 1 dose.    Dispense:  2 tablet    Refill:  0   Return if symptoms worsen or fail to improve, for at any time for any worsening symptoms, Go to Emergency room/ urgent care if worse.   Red Flags discussed. The patient was given clear instructions to go to ER or return to medical center if any red flags develop, symptoms do not improve, worsen or new problems develop. They verbalized understanding.   Marcille Buffy, FNP

## 2021-01-19 NOTE — Patient Instructions (Addendum)
Tick Bite Information, Adult  Ticks are insects that can bite. Most ticks live in shrubs and grassy areas. They climb onto people and animals that go by. Then they bite. Some ticks carry germs that can make you sick. How can I prevent tick bites? Take these steps: Use insect repellent  Use an insect repellent that has 20% or higher of the ingredients DEET, picaridin, or IR3535. Follow the instructions on the label. Put it on: ? Bare skin. ? The tops of your boots. ? Your pant legs. ? The ends of your sleeves.  If you use an insect repellent that has the ingredient permethrin, follow the instructions on the label. Put it on: ? Clothing. ? Boots. ? Supplies or outdoor gear. ? Tents. When you are outside  Wear long sleeves and long pants.  Wear light-colored clothes.  Tuck your pant legs into your socks.  Stay in the middle of the trail. Do not touch the bushes.  Avoid walking through long grass.  Check for ticks on your clothes, hair, and skin often while you are outside. Before going inside your house, check your clothes, skin, head, neck, armpits, waist, groin, and joint areas. When you go indoors  Check your clothes for ticks. Dry your clothes in a dryer on high heat for 10 minutes or more. If clothes are damp, additional time may be needed.  Wash your clothes right away if they need to be washed. Use hot water.  Check your pets and outdoor gear.  Shower right away.  Check your body for ticks. Do a full body check using a mirror. What is the right way to remove a tick? Remove the tick from your skin as soon as possible. Do not remove the tick with your bare fingers.  To remove a tick that is crawling on your skin: ? Go outdoors and brush the tick off. ? Use tape or a lint roller.  To remove a tick that is biting: 1. Wash your hands. 2. If you have latex gloves, put them on. 3. Use tweezers, curved forceps, or a tick-removal tool to grasp the tick. Grasp the tick  as close to your skin and as close to the tick's head as possible. 4. Gently pull up until the tick lets go.  Try to keep the tick's head attached to its body.  Do not twist or jerk the tick.  Do not squeeze or crush the tick. Do not try to remove a tick with heat, alcohol, petroleum jelly, or fingernail polish.   What should I do after taking out a tick?  Throw away the tick. Do not crush a tick with your fingers.  Clean the bite area and your hands with soap and water, rubbing alcohol, or an iodine wash.  If an antiseptic cream or ointment is available, apply a small amount to the bite area.  Wash and disinfect any instruments that you used to remove the tick. How should I get rid of a live tick? To dispose of a live tick, use one of these methods:  Place the tick in rubbing alcohol.  Place the tick in a bag or container you can close tightly.  Wrap the tick tightly in tape.  Flush the tick down the toilet. Contact a doctor if:  You have symptoms, such as: ? A fever or chills. ? A red rash that makes a circle (bull's-eye rash) in the bite area. ? Redness and swelling where the tick bit you. ? Headache. ?  Pain in a muscle, joint, or bone. ? Being more tired than normal. ? Trouble walking or moving your legs. ? Numbness in your legs. ? Tender and swollen lymph glands.  A part of a tick breaks off and gets stuck in your skin. Get help right away if:  You cannot remove a tick.  You cannot move (have paralysis) or feel weak.  You are feeling worse or have new symptoms.  You find a tick that is biting you and filled with blood. This is important if you are in an area where diseases from ticks are common. Summary  Ticks may carry germs that can make you sick.  To prevent tick bites wear long sleeves, long pants, and light colors. Use insect repellent. Follow the instructions on the label.  If the tick is biting, do not try to remove it with heat, alcohol, petroleum  jelly, or fingernail polish.  Use tweezers, curved forceps, or a tick-removal tool to grasp the tick. Gently pull up until the tick lets go. Do not twist or jerk the tick. Do not squeeze or crush the tick.  If you have symptoms, contact a doctor. This information is not intended to replace advice given to you by your health care provider. Make sure you discuss any questions you have with your health care provider. Document Revised: 08/03/2019 Document Reviewed: 08/03/2019 Elsevier Patient Education  2021 [place].    Lyme Disease Lyme disease is an infection that can affect many parts of the body, including the skin, joints, and nervous system. It is a bacterial infection that starts from the bite of an infected tick. Over time, the infection can worsen, and some of the symptoms are similar to the flu. If Lyme disease is not treated, it may cause joint pain, swelling, numbness, problems thinking, fatigue, muscle weakness, and other problems. What are the causes? This condition is caused by bacteria called Borrelia burgdorferi.  You can get Lyme disease by being bitten by an infected tick.  Only black-legged, or Ixodes, ticks that are infected with the bacteria can cause Lyme disease.  The tick must be attached to your skin for a certain period of time to pass along the infection. This is usually 36-48 hours.  Deer often carry infected ticks. What increases the risk? The following factors may make you more likely to develop this condition:  Living in or visiting these areas in the [place] ? [place]. ? [place]. ? [place].  Spending time in wooded or grassy areas.  Being outdoors with exposed skin.  Camping, gardening, hiking, fishing, hunting, or working outdoors.  Failing to remove a tick from your skin. What are the signs or symptoms? Symptoms of this condition may include:  Chills and fever.  Headache.  Fatigue.  General  achiness.  Muscle pain.  Joint pain, often in the knees.  A round, red rash that surrounds the center of the tick bite. The center of the rash may be blood colored or have tiny blisters.  Swollen lymph glands.  Stiff neck.   How is this diagnosed? This condition is diagnosed based on:  Your symptoms and medical history.  A physical exam.  A blood test. How is this treated? The main treatment for this condition is antibiotic medicine, which is usually taken by mouth (orally).  The length of treatment depends on how soon after a tick bite you begin taking the medicine. In some cases, treatment is necessary for several weeks.  If the infection is severe, antibiotics may need to be given through an IV that is inserted into one of your veins. Follow these instructions at home:  Take over-the-counter and prescription medicines only as told by your health care provider. Finish all antibiotic medicine, even when you start to feel better.  Ask your health care provider about taking a probiotic in between doses of your antibiotic to help avoid an upset stomach or diarrhea.  Check with your health care provider before supplementing your treatment. Many alternative therapies have not been proven and may be harmful to you.  Keep all follow-up visits as told by your health care provider. This is important. How is this prevented? You can become reinfected if you get another tick bite from an infected tick. Take these steps to help prevent an infection:  Cover your skin with light-colored clothing when you are outdoors in the spring and summer months.  Spray clothing and skin with bug spray. The spray should be 20-30% DEET. You can also treat clothing with permethrin, and let it dry before you wear it. Do not apply permethrin directly to your skin. Permethrin can also be used to treat camping gear and boots. Always read and follow the instructions that come with a bug spray or  insecticide.  Avoid wooded, grassy, and shaded areas.  Remove yard litter, brush, trash, and plants that attract deer and rodents.  Check yourself for ticks when you come indoors.  Wash clothing worn each day.  Shower after spending time outdoors.  Check your pets for ticks before they come inside.  If you find a tick attached to your skin: ? Remove it with tweezers. ? Clean your hands and the bite area with rubbing alcohol or soap and water. ? Dispose of the tick by putting it in rubbing alcohol, putting it in a sealed bag or container, or flushing it down the toilet. ? You may choose to save the tick in a sealed container if you wish for it to be tested at a later time. Pregnant women should take special care to avoid tick bites because it is possible that the infection may be passed along to the fetus.   Contact a health care provider if:  You have symptoms after treatment.  You have removed a tick and want to bring it to your health care provider for testing. Get help right away if:  You have an irregular heartbeat.  You have chest pain.  You have nerve pain.  Your face feels numb.  You develop the following: ? A stiff neck. ? A severe headache. ? Severe nausea and vomiting. ? Sensitivity to light. Summary  Lyme disease is an infection that can affect many parts of the body, including the skin, joints, and nervous system.  This condition is caused by bacteria called Borrelia burgdorferi.  You can get Lyme disease by being bitten by an infected tick.  The main treatment for this condition is antibiotic medicine. This information is not intended to replace advice given to you by your health care provider. Make sure you discuss any questions you have with your health care provider. Document Revised: 11/28/2018 Document Reviewed: 10/23/2018 Elsevier Patient Education  2021 [place] Spotted Fever [facility] spotted fever is a bacterial  infection that spreads to people through contact with certain ticks. The illness causes flu-like symptoms and a reddish-purple rash. The illness does not spread from person to person (is not contagious). When this condition is  not treated right away, it can quickly become very serious, and can sometimes lead to long-term (chronic) health problems or even death. What are the causes? This condition is caused by a type of bacteria (Rickettsia rickettsii) that is carried by [place] dog ticks, brown dog ticks, and [employer] wood ticks. The infection spreads through:  A bite from an infected tick. Tick bites are usually painless, and they frequently are not noticed.  Infected tick blood, body fluids, or feces that get into the body through damaged skin, such as a small cut or sore. This could happen while removing a tick from a pet or from another person. What increases the risk? The following factors may make you more likely to develop this condition:  Spending a lot of time outdoors, especially in rural areas or areas with long grass.  Spending time outdoors during warm weather. Ticks are most active during warm weather. What are the signs or symptoms? Symptoms of this condition include:  Fever.  Muscle aches.  Headache.  Nausea.  Vomiting.  Poor appetite.  Abdominal pain.  A reddish-purple rash. ? This usually appears 2-5 days after the first symptoms begin. ? The rash often starts on the wrists, forearms, and ankles. It may then spread to the palms, the bottom of the feet, legs, and trunk. Symptoms may develop 2-14 days after a tick bite.   How is this diagnosed? This condition is diagnosed based on:  Your medical history.  A physical exam.  Blood tests.  Whether you have recently been bitten by a tick or spent time in areas where: ? Ticks are common. ? [facility] spotted fever is common. How is this treated? This condition is treated with antibiotic medicines.  It is important to begin treatment right away. In some cases, your health care provider may begin treatment before the diagnosis is confirmed. If your symptoms are severe, you may need to be treated in the hospital where you can get antibiotics and be monitored during treatment. Follow these instructions at home:  Take over-the-counter and prescription medicines only as told by your health care provider.  Take your antibiotic medicine as told by your health care provider. Do not stop taking the antibiotic even if you start to feel better.  Rest as much as possible until you feel better. Return to your normal activities as told by your health care provider.  Drink enough fluid to keep your urine pale yellow.  Keep all follow-up visits as told by your health care provider. This is important. Contact a health care provider if:  You have a rash that gets increasingly red or swollen.  You have fluid draining from any areas of your rash. Get help right away if:  You develop a fever after being bitten by a tick.  You develop a rash 2-5 days after experiencing flu-like symptoms.  You have chest pain.  You have shortness of breath.  You have a severe headache.  You have jerky movements you cannot control (seizure).  You have severe abdominal pain.  You feel confused.  You bruise easily.  You have bleeding from your gums.  You have blood in your stool (feces).  You have trouble controlling when you urinate or have bowel movements (incontinence).  You have vision problems.  You have numbness or tingling in your arms or legs. Summary  [facility] spotted fever is a bacterial infection that spreads to people through contact with certain ticks.  When this condition is not treated  right away, it can quickly become very serious, and can sometimes lead to long-term (chronic) health problems or even death.  You are more likely to develop this infection if you spend time outdoors  in warm weather and in areas with tall grass.  Symptoms of this condition include fever, headache, nausea, vomiting, abdominal pain, muscle aches, and a reddish-purple rash that usually appears 2-5 days after a fever.  This condition is treated with antibiotic medicines. This information is not intended to replace advice given to you by your health care provider. Make sure you discuss any questions you have with your health care provider. Document Revised: 08/09/2017 Document Reviewed: 11/01/2016 Elsevier Patient Education  2021 [place]. Doxycycline tablets or capsules What is this medicine? DOXYCYCLINE (dox i SYE kleen) is a tetracycline antibiotic. It kills certain bacteria or stops their growth. It is used to treat many kinds of infections, like dental, skin, respiratory, and urinary tract infections. It also treats acne, Lyme disease, malaria, and certain sexually transmitted infections. This medicine may be used for other purposes; ask your health care provider or pharmacist if you have questions. COMMON BRAND NAME(S): Acticlate, Adoxa, Adoxa CK, Adoxa Pak, Adoxa TT, Alodox, Avidoxy, Doxal, LYMEPAK, Mondoxyne NL, Monodox, Morgidox 1x, Morgidox 1x Kit, Morgidox 2x, Morgidox 2x Kit, NutriDox, Ocudox, [place], [place], [place], Vibra-Tabs, Vibramycin What should I tell my health care provider before I take this medicine? They need to know if you have any of these conditions:  liver disease  long exposure to sunlight like working outdoors  stomach problems like colitis  an unusual or allergic reaction to doxycycline, tetracycline antibiotics, other medicines, foods, dyes, or preservatives  pregnant or trying to get pregnant  breast-feeding How should I use this medicine? Take this medicine by mouth with a full glass of water. Follow the directions on the prescription label. It is best to take this medicine without food, but if it upsets your stomach take it with food. Take your  medicine at regular intervals. Do not take your medicine more often than directed. Take all of your medicine as directed even if you think you are better. Do not skip doses or stop your medicine early. Talk to your pediatrician regarding the use of this medicine in children. While this drug may be prescribed for selected conditions, precautions do apply. Overdosage: If you think you have taken too much of this medicine contact a poison control center or emergency room at once. NOTE: This medicine is only for you. Do not share this medicine with others. What if I miss a dose? If you miss a dose, take it as soon as you can. If it is almost time for your next dose, take only that dose. Do not take double or extra doses. What may interact with this medicine?  antacids  barbiturates  birth control pills  bismuth subsalicylate  carbamazepine  methoxyflurane  other antibiotics  phenytoin  vitamins that contain iron  warfarin This list may not describe all possible interactions. Give your health care provider a list of all the medicines, herbs, non-prescription drugs, or dietary supplements you use. Also tell them if you smoke, drink alcohol, or use illegal drugs. Some items may interact with your medicine. What should I watch for while using this medicine? Tell your doctor or health care professional if your symptoms do not improve. Do not treat diarrhea with over the counter products. Contact your doctor if you have diarrhea that lasts more than 2 days or if it is  severe and watery. Do not take this medicine just before going to bed. It may not dissolve properly when you lay down and can cause pain in your throat. Drink plenty of fluids while taking this medicine to also help reduce irritation in your throat. This medicine can make you more sensitive to the sun. Keep out of the sun. If you cannot avoid being in the sun, wear protective clothing and use sunscreen. Do not use sun lamps or  tanning beds/booths. Birth control pills may not work properly while you are taking this medicine. Talk to your doctor about using an extra method of birth control. If you are being treated for a sexually transmitted infection, avoid sexual contact until you have finished your treatment. Your sexual partner may also need treatment. Avoid antacids, aluminum, calcium, magnesium, and iron products for 4 hours before and 2 hours after taking a dose of this medicine. If you are using this medicine to prevent malaria, you should still protect yourself from contact with mosquitos. Stay in screened-in areas, use mosquito nets, keep your body covered, and use an insect repellent. What side effects may I notice from receiving this medicine? Side effects that you should report to your doctor or health care professional as soon as possible:  allergic reactions like skin rash, itching or hives, swelling of the face, lips, or tongue  difficulty breathing  fever  itching in the rectal or genital area  pain on swallowing  rash, fever, and swollen lymph nodes  redness, blistering, peeling or loosening of the skin, including inside the mouth  severe stomach pain or cramps  unusual bleeding or bruising  unusually weak or tired  yellowing of the eyes or skin Side effects that usually do not require medical attention (report to your doctor or health care professional if they continue or are bothersome):  diarrhea  loss of appetite  nausea, vomiting This list may not describe all possible side effects. Call your doctor for medical advice about side effects. You may report side effects to FDA at 1-800-FDA-1088. Where should I keep my medicine? Keep out of the reach of children. Store at room temperature, below 30 degrees C (86 degrees F). Protect from light. Keep container tightly closed. Throw away any unused medicine after the expiration date. Taking this medicine after the expiration date can make  you seriously ill. NOTE: This sheet is a summary. It may not cover all possible information. If you have questions about this medicine, talk to your doctor, pharmacist, or health care provider.  2021 Elsevier/Gold Standard (2018-11-06 13:44:53)

## 2021-02-05 ENCOUNTER — Other Ambulatory Visit: Payer: Self-pay | Admitting: Family Medicine

## 2021-02-13 ENCOUNTER — Other Ambulatory Visit: Payer: Self-pay | Admitting: Family Medicine

## 2021-02-28 ENCOUNTER — Ambulatory Visit (INDEPENDENT_AMBULATORY_CARE_PROVIDER_SITE_OTHER): Payer: Medicare HMO | Admitting: Family Medicine

## 2021-02-28 ENCOUNTER — Encounter: Payer: Self-pay | Admitting: Family Medicine

## 2021-02-28 ENCOUNTER — Other Ambulatory Visit: Payer: Self-pay

## 2021-02-28 VITALS — BP 98/60 | HR 81 | Temp 98.7°F | Ht 65.0 in | Wt 182.6 lb

## 2021-02-28 DIAGNOSIS — R0602 Shortness of breath: Secondary | ICD-10-CM | POA: Diagnosis not present

## 2021-02-28 DIAGNOSIS — R0609 Other forms of dyspnea: Secondary | ICD-10-CM | POA: Insufficient documentation

## 2021-02-28 DIAGNOSIS — I10 Essential (primary) hypertension: Secondary | ICD-10-CM | POA: Diagnosis not present

## 2021-02-28 DIAGNOSIS — R0600 Dyspnea, unspecified: Secondary | ICD-10-CM

## 2021-02-28 LAB — COMPREHENSIVE METABOLIC PANEL
ALT: 16 U/L (ref 0–53)
AST: 14 U/L (ref 0–37)
Albumin: 4.2 g/dL (ref 3.5–5.2)
Alkaline Phosphatase: 66 U/L (ref 39–117)
BUN: 25 mg/dL — ABNORMAL HIGH (ref 6–23)
CO2: 23 mEq/L (ref 19–32)
Calcium: 9.2 mg/dL (ref 8.4–10.5)
Chloride: 102 mEq/L (ref 96–112)
Creatinine, Ser: 1.35 mg/dL (ref 0.40–1.50)
GFR: 54.96 mL/min — ABNORMAL LOW (ref >60.00)
Glucose, Bld: 105 mg/dL — ABNORMAL HIGH (ref 70–99)
Potassium: 4.1 mEq/L (ref 3.5–5.1)
Sodium: 136 mEq/L (ref 135–145)
Total Bilirubin: 0.6 mg/dL (ref 0.2–1.2)
Total Protein: 6.2 g/dL (ref 6.0–8.3)

## 2021-02-28 LAB — CBC
HCT: 38.6 % — ABNORMAL LOW (ref 39.0–52.0)
Hemoglobin: 13.3 g/dL (ref 13.0–17.0)
MCHC: 34.4 g/dL (ref 30.0–36.0)
MCV: 92.5 fl (ref 78.0–100.0)
Platelets: 292 10*3/uL (ref 150.0–400.0)
RBC: 4.17 Mil/uL — ABNORMAL LOW (ref 4.22–5.81)
RDW: 13 % (ref 11.5–15.5)
WBC: 9.9 10*3/uL (ref 4.0–10.5)

## 2021-02-28 LAB — TSH: TSH: 0.56 u[IU]/mL (ref 0.35–5.50)

## 2021-02-28 NOTE — Patient Instructions (Signed)
Nice to see you. We will get lab work today and contact you with the results. We will refer you to pulmonology. If you develop persistent shortness of breath or if you develop chest pain you need to be evaluated immediately.

## 2021-02-28 NOTE — Progress Notes (Signed)
Tommi Rumps, MD Phone: [phone]  William Yang is a 65 y.o. male who presents today for f/u.  HYPERTENSION Disease Monitoring Home BP Monitoring not checking Chest pain- no    Dyspnea- see below Medications Compliance-  taking HCTZ, lisinopril. Lightheadedness-  no  Edema- no  Chronic dyspnea on exertion: This has been going on for the better part of this year.  He notes it only occurs with exertion and resolves within a minute after stopping.  He notes no wheezing.  Does have some chronic cough related to cigarette smoking.  No chest pain.  No diaphoresis.  He does report asbestos exposure and did have pleural plaques noted on recent CT lung cancer screening.   Social History   Tobacco Use  Smoking Status Every Day   Packs/day: 1.00   Years: 45.00   Pack years: 45.00   Types: Cigarettes  Smokeless Tobacco Never    Current Outpatient Medications on File Prior to Visit  Medication Sig Dispense Refill   hydrochlorothiazide (HYDRODIURIL) 25 MG tablet TAKE ONE TABLET BY MOUTH DAILY 30 tablet 4   lisinopril (ZESTRIL) 40 MG tablet Take 1 tablet (40 mg total) by mouth daily. 90 tablet 1   sildenafil (VIAGRA) 50 MG tablet TAKE ONE TABLET BY MOUTH DAILY AS NEEDED FOR ERECTILE DYSFUNCTION 5 tablet 0   simvastatin (ZOCOR) 40 MG tablet TAKE ONE TABLET BY MOUTH DAILY 90 tablet 0   No current facility-administered medications on file prior to visit.     ROS see history of present illness  Objective  Physical Exam Vitals:   02/28/21 1224  BP: 98/60  Pulse: 81  Temp: 98.7 F (37.1 C)  SpO2: 97%    BP Readings from Last 3 Encounters:  02/28/21 98/60  01/19/21 126/74  10/24/20 120/80   Wt Readings from Last 3 Encounters:  02/28/21 182 lb 9.6 oz (82.8 kg)  01/19/21 180 lb 12.8 oz (82 kg)  10/24/20 183 lb 6.4 oz (83.2 kg)    Physical Exam Constitutional:      General: He is not in acute distress.    Appearance: He is not diaphoretic.  Cardiovascular:     Rate and  Rhythm: Normal rate and regular rhythm.     Heart sounds: Normal heart sounds.  Pulmonary:     Effort: Pulmonary effort is normal.     Breath sounds: Normal breath sounds.  Musculoskeletal:     Right lower leg: No edema.     Left lower leg: No edema.  Skin:    General: Skin is warm and dry.  Neurological:     Mental Status: He is alert.   EKG: Normal sinus rhythm, left atrial enlargement, inverted T wave in lead III, V1, and V2, nonspecific  Assessment/Plan: Please see individual problem list.  Problem List Items Addressed This Visit     Dyspnea on exertion    Chronic issue.  Discussed the likelihood of a pulmonary issue contributing to this given his CT findings as well as his smoking history.  Also discussed potential for cardiac cause.  EKG with nonspecific findings.  Lab work as outlined.  Will refer to pulmonology and cardiology.  He was advised to seek medical attention for any persistent shortness of breath or if he develops chest pain. I did encourage smoking cessation.        Relevant Orders   Comp Met (CMET)   CBC   TSH   Ambulatory referral to Pulmonology   Ambulatory referral to Cardiology   Essential  hypertension    Well-controlled.  He will continue on lisinopril 40 mg once daily and HCTZ 25 mg daily.  He will monitor for lightheadedness and if this occurs he will let us know.       Other Visit Diagnoses     SOB (shortness of breath)    -  Primary   Relevant Orders   EKG 12-Lead (Completed)   Comp Met (CMET)   CBC   TSH   Ambulatory referral to Pulmonology       Return in about 3 months (around 05/31/2021) for Hypertension, dyspnea.  This visit occurred during the SARS-CoV-2 public health emergency.  Safety protocols were in place, including screening questions prior to the visit, additional usage of staff PPE, and extensive cleaning of exam room while observing appropriate contact time as indicated for disinfecting solutions.    Tommi Rumps,  MD [place]

## 2021-02-28 NOTE — Assessment & Plan Note (Signed)
Well-controlled.  He will continue on lisinopril 40 mg once daily and HCTZ 25 mg daily.  He will monitor for lightheadedness and if this occurs he will let us know.

## 2021-02-28 NOTE — Assessment & Plan Note (Addendum)
Chronic issue.  Discussed the likelihood of a pulmonary issue contributing to this given his CT findings as well as his smoking history.  Also discussed potential for cardiac cause.  EKG with nonspecific findings.  Lab work as outlined.  Will refer to pulmonology and cardiology.  He was advised to seek medical attention for any persistent shortness of breath or if he develops chest pain. I did encourage smoking cessation.

## 2021-03-02 ENCOUNTER — Encounter: Payer: Self-pay | Admitting: Cardiology

## 2021-03-02 ENCOUNTER — Ambulatory Visit: Payer: Medicare HMO | Admitting: Cardiology

## 2021-03-02 ENCOUNTER — Other Ambulatory Visit: Payer: Self-pay

## 2021-03-02 VITALS — BP 128/80 | HR 74 | Ht 66.0 in | Wt 182.0 lb

## 2021-03-02 DIAGNOSIS — R0600 Dyspnea, unspecified: Secondary | ICD-10-CM

## 2021-03-02 DIAGNOSIS — E7800 Pure hypercholesterolemia, unspecified: Secondary | ICD-10-CM | POA: Diagnosis not present

## 2021-03-02 DIAGNOSIS — F17200 Nicotine dependence, unspecified, uncomplicated: Secondary | ICD-10-CM

## 2021-03-02 DIAGNOSIS — R69 Illness, unspecified: Secondary | ICD-10-CM | POA: Diagnosis not present

## 2021-03-02 DIAGNOSIS — R0609 Other forms of dyspnea: Secondary | ICD-10-CM

## 2021-03-02 DIAGNOSIS — I2510 Atherosclerotic heart disease of native coronary artery without angina pectoris: Secondary | ICD-10-CM

## 2021-03-02 DIAGNOSIS — I10 Essential (primary) hypertension: Secondary | ICD-10-CM

## 2021-03-02 MED ORDER — ATORVASTATIN CALCIUM 40 MG PO TABS: 40.0000 mg | ORAL_TABLET | Freq: Every day | ORAL | 5 refills | Status: DC

## 2021-03-02 MED ORDER — ASPIRIN EC 81 MG PO TBEC: 81.0000 mg | DELAYED_RELEASE_TABLET | Freq: Every day | ORAL | 3 refills | Status: DC

## 2021-03-02 NOTE — Patient Instructions (Signed)
Medication Instructions:    STOP taking your Simvastatin.  2.    START taking Lipitor (Atorvastatin) 40 MG once a day.  3.    START taking Aspirin 81 MG (you can buy this over the counter)  *If you need a refill on your cardiac medications before your next appointment, please call your pharmacy*   Lab Work:  Your physician recommends that you return for a FASTING lipid profile: IN 3 MONTHS   - You will need to be fasting. Please do not have anything to eat or drink after midnight the morning you have the lab work. You may only have water or black coffee with no cream or sugar.  - Please go to the [facility]. You will check in at the front desk to the right as you walk into the atrium. Valet Parking is offered if needed. - No appointment needed. You may go any day between 7 am and 6 pm.     Testing/Procedures:   Your physician has requested that you have an echocardiogram. Echocardiography is a painless test that uses sound waves to create images of your heart. It provides your doctor with information about the size and shape of your heart and how well your heart's chambers and valves are working. This procedure takes approximately one hour. There are no restrictions for this procedure.  2.     [place]   Your caregiver has ordered a Stress Test with nuclear imaging. The purpose of this test is to evaluate the blood supply to your heart muscle. This procedure is referred to as a "Non-Invasive Stress Test." This is because other than having an IV started in your vein, nothing is inserted or "invades" your body. Cardiac stress tests are done to find areas of poor blood flow to the heart by determining the extent of coronary artery disease (CAD). Some patients exercise on a treadmill, which naturally increases the blood flow to your heart, while others who are  unable to walk on a treadmill due to physical limitations have a pharmacologic/chemical stress agent called Lexiscan . This  medicine will mimic walking on a treadmill by temporarily increasing your coronary blood flow.      PLEASE REPORT TO [facility] MEDICAL MALL ENTRANCE   THE VOLUNTEERS AT THE FIRST DESK WILL DIRECT YOU WHERE TO GO    *Please note: these test may take anywhere between 2-4 hours to complete    Date of Procedure:_____________________________________   Arrival Time for Procedure:______________________________    PLEASE NOTIFY THE OFFICE AT LEAST 24 HOURS IN ADVANCE IF YOU ARE UNABLE TO KEEP YOUR APPOINTMENT.  [place] 24 HOURS IN ADVANCE IF YOU ARE UNABLE TO KEEP YOUR APPOINTMENT. [phone]    How to prepare for your Myoview test:    _XX___:  Hold other medications as follows: hydrochlorothiazide (HYDRODIURIL)   1. Do not eat or drink after midnight  2. No caffeine for 24 hours prior to test  3. No smoking 24 hours prior to test.  4. Unless instructed otherwise, Take your medication with a small sips of water.    5.         Ladies, please do not wear dresses. Skirts or pants are appropriate. Please wear a short sleeve shirt.  6. No perfume, cologne or lotion.  7. Wear comfortable walking shoes. No heels!      Follow-Up: At [facility], you and your health needs are our priority.  As  part of our continuing mission to provide you with exceptional heart care, we have created designated Provider Care Teams.  These Care Teams include your primary Cardiologist (physician) and Advanced Practice Providers (APPs -  Physician Assistants and Nurse Practitioners) who all work together to provide you with the care you need, when you need it.  We recommend signing up for the patient portal called "MyChart".  Sign up information is provided on this After Visit Summary.  MyChart is used to connect with patients for Virtual Visits (Telemedicine).  Patients are able to view lab/test results, encounter notes, upcoming appointments, etc.  Non-urgent  messages can be sent to your provider as well.   To learn more about what you can do with MyChart, go to [email or web address].    Your next appointment:   Follow up after testing   The format for your next appointment:   In Person  Provider:   Kate Sable, MD   Other Instructions

## 2021-03-02 NOTE — Progress Notes (Signed)
Cardiology Office Note:    Date:  03/02/2021   ID:  William Yang, DOB 1956-03-02, MRN [id number]  PCP:  Leone Haven, MD   [place] Providers Cardiologist:  Kate Sable, MD     Referring MD: Leone Haven, MD   Chief Complaint  Patient presents with   New Patient (Initial Visit)    Referred by PCP for Dyspnea on exertion. Meds reviewed verbally with patient.     History of Present Illness:    William Yang is a 65 y.o. male with a hx of hypertension, hyperlipidemia, current smoker x40+ years who presents due to shortness of breath on exertion.  Endorses shortness of breath with exertion over the past 2 months.  Noticed symptoms when he was moving furniture and also going up stairs about 3 weeks ago.  Denies chest pain.  Denies any personal history of heart disease.  Father had a heart attack in his 13s.  He states being exposed to asbestos for about 10 years when he was in his 28s.  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  Pulmonary referral was placed by primary care provider.  Patient waiting for a call from pulmonary medicine.    Past Medical History:  Diagnosis Date   Chickenpox    Hyperlipidemia    Hypertension     Past Surgical History:  Procedure Laterality Date   APPENDECTOMY  1964    Current Medications: Current Meds  Medication Sig   aspirin EC 81 MG tablet Take 1 tablet (81 mg total) by mouth daily. Swallow whole.   atorvastatin (LIPITOR) 40 MG tablet Take 1 tablet (40 mg total) by mouth daily.   hydrochlorothiazide (HYDRODIURIL) 25 MG tablet TAKE ONE TABLET BY MOUTH DAILY   lisinopril (ZESTRIL) 40 MG tablet Take 1 tablet (40 mg total) by mouth daily.   sildenafil (VIAGRA) 50 MG tablet TAKE ONE TABLET BY MOUTH DAILY AS NEEDED FOR ERECTILE DYSFUNCTION   [DISCONTINUED] simvastatin (ZOCOR) 40 MG  tablet TAKE ONE TABLET BY MOUTH DAILY     Allergies:   Patient has no known allergies.   Social History   Socioeconomic History   Marital status: Married    Spouse name: Not on file   Number of children: Not on file   Years of education: Not on file   Highest education level: Not on file  Occupational History   Not on file  Tobacco Use   Smoking status: Every Day    Packs/day: 1.00    Years: 45.00    Pack years: 45.00    Types: Cigarettes   Smokeless tobacco: Never  Vaping Use   Vaping Use: Never used  Substance and Sexual Activity   Alcohol use: Yes    Alcohol/week: 21.0 standard drinks    Types: 21 Standard drinks or equivalent per week    Comment: 21 beers a week    Drug use: No   Sexual activity: Not on file  Other Topics Concern   Not on file  Social History Narrative   Not on file   Social Determinants of Health   Financial Resource Strain: Not on file  Food Insecurity: Not on file  Transportation Needs: Not on file  Physical Activity: Not on file  Stress: Not on file  Social Connections: Not on file     Family History: The patient's family history includes Alcoholism in an  other family member; Hypertension in an other family member; Lung cancer in his brother.  ROS:   Please see the history of present illness.     All other systems reviewed and are negative.  EKGs/Labs/Other Studies Reviewed:    The following studies were reviewed today:   EKG:  EKG not ordered today.    Outside EKG obtained 02/28/2021 reviewed showing sinus rhythm.  Nonspecific T wave inversions.  Recent Labs: 02/28/2021: ALT 16; BUN 25; Creatinine, Ser 1.35; Hemoglobin 13.3; Platelets 292.0; Potassium 4.1; Sodium 136; TSH 0.56  Recent Lipid Panel    Component Value Date/Time   CHOL 170 06/20/2020 1056   TRIG 205.0 (H) 06/20/2020 1056   HDL 53.80 06/20/2020 1056   CHOLHDL 3 06/20/2020 1056   VLDL 41.0 (H) 06/20/2020 1056   LDLCALC 67 06/25/2018 1629   LDLDIRECT 94.0  06/20/2020 1056     Risk Assessment/Calculations:          Physical Exam:    VS:  BP 128/80 (BP Location: Left Arm, Patient Position: Sitting, Cuff Size: Normal)   Pulse 74   Ht 5\' 6"  (1.676 m)   Wt 182 lb (82.6 kg)   SpO2 99%   BMI 29.38 kg/m     Wt Readings from Last 3 Encounters:  03/02/21 182 lb (82.6 kg)  02/28/21 182 lb 9.6 oz (82.8 kg)  01/19/21 180 lb 12.8 oz (82 kg)     GEN:  Well nourished, well developed in no acute distress HEENT: Normal NECK: No JVD; No carotid bruits LYMPHATICS: No lymphadenopathy CARDIAC: RRR, no murmurs, rubs, gallops RESPIRATORY:  Clear to auscultation without rales, wheezing or rhonchi  ABDOMEN: Soft, non-tender, non-distended MUSCULOSKELETAL:  No edema; No deformity  SKIN: Warm and dry NEUROLOGIC:  Alert and oriented x 3 PSYCHIATRIC:  Normal affect   ASSESSMENT:    1. Dyspnea on exertion   2. Primary hypertension   3. Pure hypercholesterolemia   4. Smoking   5. Coronary artery disease, unspecified vessel or lesion type, unspecified whether angina present, unspecified whether native or transplanted heart    PLAN:    In order of problems listed above:  Dyspnea on exertion, risk factors include hypertension, hyperlipidemia, current smoker.  This could be an anginal equivalent we will do pulmonary etiology also very likely with recent chest CT showing emphysema and possible asbestos with bilateral pleural plaques.  Get echo to evaluate systolic and diastolic function, get Lexiscan Myoview to evaluate presence of ischemia. Hypertension, BP controlled.  On lisinopril, HCTZ. Hyperlipidemia, statin controlled but not at goal.  Advised low-cholesterol diet, repeat fasting lipid profile. Current smoker, cessation advised. CAD/coronary calcifications noted on chest CT.  Recommend starting aspirin 81 mg daily, stop simvastatin, start Lipitor 40 mg daily.  Obtain fasting lipid profile in 3 months, with LDL goal less than 70.  Titrate statin  as needed for adequate LDL control.  Lexiscan Myoview and echo as above.  Follow-up after echo and Myoview   Shared Decision Making/Informed Consent The risks [chest pain, shortness of breath, cardiac arrhythmias, dizziness, blood pressure fluctuations, myocardial infarction, stroke/transient ischemic attack, nausea, vomiting, allergic reaction, radiation exposure, metallic taste sensation and life-threatening complications (estimated to be 1 in 10,000)], benefits (risk stratification, diagnosing coronary artery disease, treatment guidance) and alternatives of a nuclear stress test were discussed in detail with Mr. Mathey and he agrees to proceed.    Medication Adjustments/Labs and Tests Ordered: Current medicines are reviewed at length with the patient today.  Concerns regarding medicines are  outlined above.  Orders Placed This Encounter  Procedures   NM Myocar Multi W/Spect W/Wall Motion / EF   Lipid panel   ECHOCARDIOGRAM COMPLETE    Meds ordered this encounter  Medications   atorvastatin (LIPITOR) 40 MG tablet    Sig: Take 1 tablet (40 mg total) by mouth daily.    Dispense:  30 tablet    Refill:  5   aspirin EC 81 MG tablet    Sig: Take 1 tablet (81 mg total) by mouth daily. Swallow whole.    Dispense:  90 tablet    Refill:  3     Patient Instructions  Medication Instructions:    STOP taking your Simvastatin.  2.    START taking Lipitor (Atorvastatin) 40 MG once a day.  3.    START taking Aspirin 81 MG (you can buy this over the counter)  *If you need a refill on your cardiac medications before your next appointment, please call your pharmacy*   Lab Work:  Your physician recommends that you return for a FASTING lipid profile: IN 3 MONTHS   - You will need to be fasting. Please do not have anything to eat or drink after midnight the morning you have the lab work. You may only have water or black coffee with no cream or sugar.  - Please go to the [facility]. You  will check in at the front desk to the right as you walk into the atrium. Valet Parking is offered if needed. - No appointment needed. You may go any day between 7 am and 6 pm.     Testing/Procedures:   Your physician has requested that you have an echocardiogram. Echocardiography is a painless test that uses sound waves to create images of your heart. It provides your doctor with information about the size and shape of your heart and how well your heart's chambers and valves are working. This procedure takes approximately one hour. There are no restrictions for this procedure.  2.     [place]   Your caregiver has ordered a Stress Test with nuclear imaging. The purpose of this test is to evaluate the blood supply to your heart muscle. This procedure is referred to as a "Non-Invasive Stress Test." This is because other than having an IV started in your vein, nothing is inserted or "invades" your body. Cardiac stress tests are done to find areas of poor blood flow to the heart by determining the extent of coronary artery disease (CAD). Some patients exercise on a treadmill, which naturally increases the blood flow to your heart, while others who are  unable to walk on a treadmill due to physical limitations have a pharmacologic/chemical stress agent called Lexiscan . This medicine will mimic walking on a treadmill by temporarily increasing your coronary blood flow.      PLEASE REPORT TO [facility] MEDICAL MALL ENTRANCE   THE VOLUNTEERS AT THE FIRST DESK WILL DIRECT YOU WHERE TO GO    *Please note: these test may take anywhere between 2-4 hours to complete    Date of Procedure:_____________________________________   Arrival Time for Procedure:______________________________    PLEASE NOTIFY THE OFFICE AT LEAST 24 HOURS IN ADVANCE IF YOU ARE UNABLE TO KEEP YOUR APPOINTMENT.  [place] 24 HOURS IN ADVANCE IF YOU ARE UNABLE TO KEEP YOUR  APPOINTMENT. [phone]    How to prepare for your Myoview test:    _XX___:  Hold  other medications as follows: hydrochlorothiazide (HYDRODIURIL)   1. Do not eat or drink after midnight  2. No caffeine for 24 hours prior to test  3. No smoking 24 hours prior to test.  4. Unless instructed otherwise, Take your medication with a small sips of water.    5.         Ladies, please do not wear dresses. Skirts or pants are appropriate. Please wear a short sleeve shirt.  6. No perfume, cologne or lotion.  7. Wear comfortable walking shoes. No heels!      Follow-Up: At [facility], you and your health needs are our priority.  As part of our continuing mission to provide you with exceptional heart care, we have created designated Provider Care Teams.  These Care Teams include your primary Cardiologist (physician) and Advanced Practice Providers (APPs -  Physician Assistants and Nurse Practitioners) who all work together to provide you with the care you need, when you need it.  We recommend signing up for the patient portal called "MyChart".  Sign up information is provided on this After Visit Summary.  MyChart is used to connect with patients for Virtual Visits (Telemedicine).  Patients are able to view lab/test results, encounter notes, upcoming appointments, etc.  Non-urgent messages can be sent to your provider as well.   To learn more about what you can do with MyChart, go to [email or web address].    Your next appointment:   Follow up after testing   The format for your next appointment:   In Person  Provider:   Kate Sable, MD   Other Instructions    Signed, Kate Sable, MD  03/02/2021 1:06 PM    [place]

## 2021-03-04 ENCOUNTER — Other Ambulatory Visit: Payer: Self-pay | Admitting: Family Medicine

## 2021-03-04 DIAGNOSIS — D649 Anemia, unspecified: Secondary | ICD-10-CM

## 2021-03-04 DIAGNOSIS — N179 Acute kidney failure, unspecified: Secondary | ICD-10-CM

## 2021-03-24 ENCOUNTER — Other Ambulatory Visit (INDEPENDENT_AMBULATORY_CARE_PROVIDER_SITE_OTHER): Payer: Medicare HMO

## 2021-03-24 ENCOUNTER — Other Ambulatory Visit: Payer: Self-pay

## 2021-03-24 DIAGNOSIS — D649 Anemia, unspecified: Secondary | ICD-10-CM | POA: Diagnosis not present

## 2021-03-24 DIAGNOSIS — N179 Acute kidney failure, unspecified: Secondary | ICD-10-CM

## 2021-03-24 LAB — BASIC METABOLIC PANEL
BUN: 14 mg/dL (ref 6–23)
CO2: 25 mEq/L (ref 19–32)
Calcium: 9.3 mg/dL (ref 8.4–10.5)
Chloride: 102 mEq/L (ref 96–112)
Creatinine, Ser: 1.07 mg/dL (ref 0.40–1.50)
GFR: 72.61 mL/min (ref >60.00)
Glucose, Bld: 83 mg/dL (ref 70–99)
Potassium: 4.5 mEq/L (ref 3.5–5.1)
Sodium: 135 mEq/L (ref 135–145)

## 2021-03-24 LAB — CBC
HCT: 40.6 % (ref 39.0–52.0)
Hemoglobin: 13.9 g/dL (ref 13.0–17.0)
MCHC: 34.4 g/dL (ref 30.0–36.0)
MCV: 99.8 fl (ref 78.0–100.0)
Platelets: 272 10*3/uL (ref 150.0–400.0)
RBC: 4.06 Mil/uL — ABNORMAL LOW (ref 4.22–5.81)
RDW: 13.5 % (ref 11.5–15.5)
WBC: 10.1 10*3/uL (ref 4.0–10.5)

## 2021-04-19 ENCOUNTER — Ambulatory Visit (INDEPENDENT_AMBULATORY_CARE_PROVIDER_SITE_OTHER): Payer: Medicare HMO

## 2021-04-19 ENCOUNTER — Other Ambulatory Visit: Payer: Self-pay

## 2021-04-19 ENCOUNTER — Ambulatory Visit
Admission: RE | Admit: 2021-04-19 | Discharge: 2021-04-19 | Disposition: A | Discharge status: Home / Self Care | Payer: Medicare HMO | Source: Ambulatory Visit | Attending: Cardiology | Admitting: Cardiology

## 2021-04-19 DIAGNOSIS — R0600 Dyspnea, unspecified: Secondary | ICD-10-CM

## 2021-04-19 DIAGNOSIS — R0609 Other forms of dyspnea: Secondary | ICD-10-CM

## 2021-04-19 DIAGNOSIS — I2510 Atherosclerotic heart disease of native coronary artery without angina pectoris: Secondary | ICD-10-CM | POA: Diagnosis not present

## 2021-04-19 LAB — NM MYOCAR MULTI W/SPECT W/WALL MOTION / EF
LV dias vol: 77 mL (ref 62–150)
LV sys vol: 24 mL
Nuc Stress EF: 69 %
Peak HR: 91 {beats}/min
Percent HR: 58 %
Rest HR: 56 {beats}/min
Rest Nuclear Isotope Dose: 10.3 mCi
SDS: 0
SRS: 2
SSS: 0
ST Depression (mm): 0 mm
Stress Nuclear Isotope Dose: 28.9 mCi
TID: 0.89

## 2021-04-19 LAB — ECHOCARDIOGRAM COMPLETE
AR max vel: 3.62 cm2
AV Area VTI: 3.94 cm2
AV Area mean vel: 3.77 cm2
AV Mean grad: 7 mmHg
AV Peak grad: 12.8 mmHg
Ao pk vel: 1.79 m/s
Area-P 1/2: 4.39 cm2
MV VTI: 5.3 cm2
S' Lateral: 3 cm

## 2021-04-19 MED ORDER — TECHNETIUM TC 99M TETROFOSMIN IV KIT
30.0000 | PACK | Freq: Once | INTRAVENOUS | Status: AC
  Administered 2021-04-19: 28.92 via INTRAVENOUS

## 2021-04-19 MED ORDER — REGADENOSON 0.4 MG/5ML IV SOLN
0.4000 mg | Freq: Once | INTRAVENOUS | Status: AC
  Administered 2021-04-19: 0.4 mg via INTRAVENOUS

## 2021-04-19 MED ORDER — TECHNETIUM TC 99M TETROFOSMIN IV KIT
10.0000 | PACK | Freq: Once | INTRAVENOUS | Status: AC | PRN
  Administered 2021-04-19: 10.3 via INTRAVENOUS

## 2021-04-20 ENCOUNTER — Ambulatory Visit: Payer: Medicare HMO | Admitting: Cardiology

## 2021-04-20 ENCOUNTER — Encounter: Payer: Self-pay | Admitting: Cardiology

## 2021-04-20 VITALS — BP 140/72 | HR 76 | Ht 66.0 in | Wt 181.0 lb

## 2021-04-20 DIAGNOSIS — F17200 Nicotine dependence, unspecified, uncomplicated: Secondary | ICD-10-CM

## 2021-04-20 DIAGNOSIS — I2510 Atherosclerotic heart disease of native coronary artery without angina pectoris: Secondary | ICD-10-CM | POA: Diagnosis not present

## 2021-04-20 DIAGNOSIS — R69 Illness, unspecified: Secondary | ICD-10-CM | POA: Diagnosis not present

## 2021-04-20 DIAGNOSIS — E7800 Pure hypercholesterolemia, unspecified: Secondary | ICD-10-CM | POA: Diagnosis not present

## 2021-04-20 DIAGNOSIS — I10 Essential (primary) hypertension: Secondary | ICD-10-CM | POA: Diagnosis not present

## 2021-04-20 NOTE — Progress Notes (Signed)
Cardiology Office Note:    Date:  04/20/2021   ID:  William Yang, DOB 12/20/1955, MRN [id number]  PCP:  Leone Haven, MD   [facility] Providers Cardiologist:  Kate Sable, MD     Referring MD: Leone Haven, MD   Chief Complaint  Patient presents with   Other    Follow up post Testing. Meds reviewed verbally with patient.     History of Present Illness:    William Yang is a 65 y.o. male with a hx of hypertension, hyperlipidemia, coronary artery calcifications, current smoker x40+ years who presents for follow-up.  Previously seen due to shortness of breath on exertion.  Due to risk factors including hypertension, hyperlipidemia, current smoker, coronary artery calcifications, echocardiogram and Lexiscan Myoview were ordered to evaluate cardiac etiology, presence of ischemia.  He now presents for results.  He states overall his shortness of breath has improved, he thinks it might be him getting acclimated to the humidity.  Denies chest pain.  Had occasional muscle aches 3 times over the past month, last occurrence over 3 weeks ago, has not had any since.   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   Father had a heart attack in his 32s.  Patient was exposed to his best dose for about 10 years in his 16s.  Past Medical History:  Diagnosis Date   Chickenpox    Hyperlipidemia    Hypertension     Past Surgical History:  Procedure Laterality Date   APPENDECTOMY  1964    Current Medications: Current Meds  Medication Sig   aspirin EC 81 MG tablet Take 1 tablet (81 mg total) by mouth daily. Swallow whole.   atorvastatin (LIPITOR) 40 MG tablet Take 1 tablet (40 mg total) by mouth daily.   hydrochlorothiazide (HYDRODIURIL) 25 MG tablet TAKE ONE TABLET BY MOUTH DAILY   lisinopril (ZESTRIL) 40 MG tablet Take 1 tablet (40 mg total) by mouth daily.      Allergies:   Patient has no known allergies.   Social History   Socioeconomic History   Marital status: Married    Spouse name: Not on file   Number of children: Not on file   Years of education: Not on file   Highest education level: Not on file  Occupational History   Not on file  Tobacco Use   Smoking status: Every Day    Packs/day: 1.00    Years: 45.00    Pack years: 45.00    Types: Cigarettes   Smokeless tobacco: Never  Vaping Use   Vaping Use: Never used  Substance and Sexual Activity   Alcohol use: Yes    Alcohol/week: 21.0 standard drinks    Types: 21 Standard drinks or equivalent per week    Comment: 21 beers a week    Drug use: No   Sexual activity: Not on file  Other Topics Concern   Not on file  Social History Narrative   Not on file   Social Determinants of Health   Financial Resource Strain: Not on file  Food Insecurity: Not on file  Transportation Needs: Not on file  Physical Activity: Not on file  Stress: Not on file  Social Connections: Not on file     Family History: The patient's family history includes Alcoholism in an other family member; Hypertension in an other family member; Lung cancer in his brother.  ROS:   Please  see the history of present illness.     All other systems reviewed and are negative.  EKGs/Labs/Other Studies Reviewed:    The following studies were reviewed today:   EKG:  EKG not ordered today.    Outside EKG obtained 02/28/2021 reviewed showing sinus rhythm.  Nonspecific T wave inversions.  Recent Labs: 02/28/2021: ALT 16; TSH 0.56 03/24/2021: BUN 14; Creatinine, Ser 1.07; Hemoglobin 13.9; Platelets 272.0; Potassium 4.5; Sodium 135  Recent Lipid Panel    Component Value Date/Time   CHOL 170 06/20/2020 1056   TRIG 205.0 (H) 06/20/2020 1056   HDL 53.80 06/20/2020 1056   CHOLHDL 3 06/20/2020 1056   VLDL 41.0 (H) 06/20/2020 1056   LDLCALC 67 06/25/2018 1629   LDLDIRECT 94.0 06/20/2020 1056     Risk  Assessment/Calculations:          Physical Exam:    VS:  BP 140/72 (BP Location: Left Arm, Patient Position: Sitting, Cuff Size: Normal)   Pulse 76   Ht '5\' 6"'$  (1.676 m)   Wt 181 lb (82.1 kg)   SpO2 99%   BMI 29.21 kg/m     Wt Readings from Last 3 Encounters:  04/20/21 181 lb (82.1 kg)  03/02/21 182 lb (82.6 kg)  02/28/21 182 lb 9.6 oz (82.8 kg)     GEN:  Well nourished, well developed in no acute distress HEENT: Normal NECK: No JVD; No carotid bruits LYMPHATICS: No lymphadenopathy CARDIAC: RRR, no murmurs, rubs, gallops RESPIRATORY:  Clear to auscultation without rales, wheezing or rhonchi  ABDOMEN: Soft, non-tender, non-distended MUSCULOSKELETAL:  No edema; No deformity  SKIN: Warm and dry NEUROLOGIC:  Alert and oriented x 3 PSYCHIATRIC:  Normal affect   ASSESSMENT:    1. Coronary artery disease, unspecified vessel or lesion type, unspecified whether angina present, unspecified whether native or transplanted heart   2. Primary hypertension   3. Pure hypercholesterolemia   4. Smoking     PLAN:    In order of problems listed above:  Dyspnea on exertion, coronary artery calcifications.  Risk factors include hypertension, hyperlipidemia, current smoker.  Echocardiogram showed normal ejection fraction, EF 65%, Lexiscan Myoview with no evidence for ischemia.  Continue aspirin, Lipitor.  Pulmonary etiology likely reason for shortness of breath with exertion, especially with recent chest CT showing emphysema and possible asbestos with bilateral pleural plaques.  Follow-up with pulmonary medicine. Hypertension, continue lisinopril, HCTZ. Hyperlipidemia, statin controlled but not at goal.  Advised low-cholesterol diet, repeat fasting lipid profile. Current smoker, cessation advised.  Follow-up in 1 year.   Medication Adjustments/Labs and Tests Ordered: Current medicines are reviewed at length with the patient today.  Concerns regarding medicines are outlined above.  No  orders of the defined types were placed in this encounter.   No orders of the defined types were placed in this encounter.    Patient Instructions  Medication Instructions:  Your physician recommends that you continue on your current medications as directed. Please refer to the Current Medication list given to you today.  *If you need a refill on your cardiac medications before your next appointment, please call your pharmacy*   Lab Work: None ordered If you have labs (blood work) drawn today and your tests are completely normal, you will receive your results only by: [place] (if you have MyChart) OR A paper copy in the mail If you have any lab test that is abnormal or we need to change your treatment, we will call you to review the results.  Testing/Procedures: None ordered   Follow-Up: At [facility], you and your health needs are our priority.  As part of our continuing mission to provide you with exceptional heart care, we have created designated Provider Care Teams.  These Care Teams include your primary Cardiologist (physician) and Advanced Practice Providers (APPs -  Physician Assistants and Nurse Practitioners) who all work together to provide you with the care you need, when you need it.  We recommend signing up for the patient portal called "MyChart".  Sign up information is provided on this After Visit Summary.  MyChart is used to connect with patients for Virtual Visits (Telemedicine).  Patients are able to view lab/test results, encounter notes, upcoming appointments, etc.  Non-urgent messages can be sent to your provider as well.   To learn more about what you can do with MyChart, go to [email or web address].    Your next appointment:   1 year(s)  The format for your next appointment:   In Person  Provider:   Kate Sable, MD   Other Instructions    Signed, Kate Sable, MD  04/20/2021 10:15 AM    [place]

## 2021-04-20 NOTE — Patient Instructions (Signed)

## 2021-04-27 ENCOUNTER — Other Ambulatory Visit: Payer: Self-pay

## 2021-04-27 ENCOUNTER — Ambulatory Visit: Payer: Medicare HMO | Admitting: Internal Medicine

## 2021-04-27 ENCOUNTER — Encounter: Payer: Self-pay | Admitting: Internal Medicine

## 2021-04-27 VITALS — BP 150/60 | HR 77 | Temp 98.0°F | Ht 66.0 in | Wt 183.6 lb

## 2021-04-27 DIAGNOSIS — F17210 Nicotine dependence, cigarettes, uncomplicated: Secondary | ICD-10-CM | POA: Diagnosis not present

## 2021-04-27 DIAGNOSIS — R9389 Abnormal findings on diagnostic imaging of other specified body structures: Secondary | ICD-10-CM | POA: Diagnosis not present

## 2021-04-27 DIAGNOSIS — Z720 Tobacco use: Secondary | ICD-10-CM

## 2021-04-27 DIAGNOSIS — J449 Chronic obstructive pulmonary disease, unspecified: Secondary | ICD-10-CM

## 2021-04-27 DIAGNOSIS — R69 Illness, unspecified: Secondary | ICD-10-CM | POA: Diagnosis not present

## 2021-04-27 NOTE — Patient Instructions (Signed)
Please stop smoking  Follow-up lung cancer screening program

## 2021-04-27 NOTE — Progress Notes (Signed)
[facility] Consultation      Date: 04/27/2021,   MRN# [id number] William Yang April 25, 1956       AdmissionWeight: 183 lb 9.6 oz (83.3 kg)                 CurrentWeight: 183 lb 9.6 oz (83.3 kg) William Yang is a 65 y.o. old male seen in consultation for SOB at the request of [place].     CHIEF COMPLAINT:    SOB  HISTORY OF PRESENT ILLNESS   65 y.o. male with a hx of hypertension, hyperlipidemia Has history of  coronary artery calcifications, current smoker x40+ years who presents for follow-up.   Several months ago patient had acute shortness of breath when he was mowing the yard No other symptoms No chest pain No shortness of breath since then  Patient states he has been feeling fine He was exposed to environmental exposure to pollen and grass as well as humidity and that is what causes shortness of breath  I have explained to patient that he will need pulmonary function testing to assess for COPD however patient refuses to get any testing at this time  No exacerbation at this time No evidence of heart failure at this time No evidence or signs of infection at this time No respiratory distress No fevers, chills, nausea, vomiting, diarrhea No evidence of lower extremity edema No evidence hemoptysis   Smoking Assessment and Cessation Counseling Upon further questioning, Patient smokes 1 ppd I have advised patient to quit/stop smoking as soon as possible due to high risk for multiple medical problems  Patient is willing to quit smoking I have advised patient that we can assist and have options of Nicotine replacement therapy. I also advised patient on behavioral therapy and can provide oral medication therapy in conjunction with the other therapies Follow up next Office visit  for assessment of smoking cessation Smoking cessation counseling advised for 4 minutes   CT of the chest from Jan 04, 2021 personally reviewed today with patient Patient has a  history of asbestos exposure Right lung mass has calcified nodules Previous CT chest from January 2022 showed no growth of the right middle lung mass Findings suggest benign findings There was also some pleural-based nodules that are calcified in the left lung   PAST MEDICAL HISTORY   Past Medical History:  Diagnosis Date   Chickenpox    Hyperlipidemia    Hypertension      SURGICAL HISTORY   Past Surgical History:  Procedure Laterality Date   APPENDECTOMY  1964     FAMILY HISTORY   Family History  Problem Relation Age of Onset   Alcoholism Other        Parent   Lung cancer Brother    Hypertension Other        Parent     SOCIAL HISTORY   Social History   Tobacco Use   Smoking status: Every Day    Packs/day: 1.00    Years: 45.00    Pack years: 45.00    Types: Cigarettes   Smokeless tobacco: Never  Vaping Use   Vaping Use: Never used  Substance Use Topics   Alcohol use: Yes    Alcohol/week: 21.0 standard drinks    Types: 21 Standard drinks or equivalent per week    Comment: 21 beers a week    Drug use: No     MEDICATIONS    Home Medication:  Current Outpatient Rx   Order #: [id number] Class:  OTC   Order #: [id number] Class: Normal   Order #: [id number] Class: Normal   Order #: [id number] Class: Normal   Order #: [id number] Class: Normal    Current Medication:  Current Outpatient Medications:    aspirin EC 81 MG tablet, Take 1 tablet (81 mg total) by mouth daily. Swallow whole., Disp: 90 tablet, Rfl: 3   atorvastatin (LIPITOR) 40 MG tablet, Take 1 tablet (40 mg total) by mouth daily., Disp: 30 tablet, Rfl: 5   hydrochlorothiazide (HYDRODIURIL) 25 MG tablet, TAKE ONE TABLET BY MOUTH DAILY, Disp: 30 tablet, Rfl: 4   lisinopril (ZESTRIL) 40 MG tablet, Take 1 tablet (40 mg total) by mouth daily., Disp: 90 tablet, Rfl: 1   sildenafil (VIAGRA) 50 MG tablet, TAKE ONE TABLET BY MOUTH DAILY AS NEEDED FOR ERECTILE DYSFUNCTION, Disp: 5 tablet, Rfl:  0    ALLERGIES   Patient has no known allergies.     REVIEW OF SYSTEMS    Review of Systems:  Gen:  Denies  fever, sweats, chills weigh loss  HEENT: Denies blurred vision, double vision, ear pain, eye pain, hearing loss, nose bleeds, sore throat Cardiac:  No dizziness, chest pain or heaviness, chest tightness,edema Resp:   Denies cough or sputum porduction, shortness of breath,wheezing, hemoptysis,  Gi: Denies swallowing difficulty, stomach pain, nausea or vomiting, diarrhea, constipation, bowel incontinence Gu:  Denies bladder incontinence, burning urine Ext:   Denies Joint pain, stiffness or swelling Skin: Denies  skin rash, easy bruising or bleeding or hives Endoc:  Denies polyuria, polydipsia , polyphagia or weight change Psych:   Denies depression, insomnia or hallucinations   Other:  All other systems negative   VS: BP (!) 150/60 (BP Location: Left Arm, Patient Position: Sitting, Cuff Size: Normal)   Pulse 77   Temp 98 F (36.7 C) (Oral)   Ht '5\' 6"'$  (1.676 m)   Wt 183 lb 9.6 oz (83.3 kg)   SpO2 99%   BMI 29.63 kg/m      PHYSICAL EXAM  Physical Examination:   General Appearance: No distress  EYES PERRLA, EOM intact.   NECK Supple, No JVD Pulmonary: normal breath sounds, No wheezing.  CardiovascularNormal S1,S2.  No m/r/g.   Abdomen: Benign, Soft, non-tender. Skin:   warm, no rashes, no ecchymosis  Extremities: normal, no cyanosis, clubbing. Neuro:without focal findings,  speech normal  PSYCHIATRIC: Mood, affect within normal limits.   ALL OTHER ROS ARE NEGATIVE     IMAGING    NM Myocar Multi W/Spect W/Wall Motion / EF  Result Date: 04/19/2021   The study is normal. The study is low risk.   No ST deviation was noted.   LV perfusion is normal.   There is normal wall motion in the defect area.   Nuclear stress EF: 69 %. The left ventricular ejection fraction is normal (55-65%). Left ventricular function is normal.   ECHOCARDIOGRAM  COMPLETE  Result Date: 04/19/2021    ECHOCARDIOGRAM REPORT   Patient Name:   William Yang Date of Exam: 04/19/2021 Medical Rec #:  [id number]  Height:       66.0 in Accession #:    [id number] Weight:       182.0 lb Date of Birth:  December 14, 1955   BSA:          1.921 m Patient Age:    64 years   BP:           138/70 mmHg Patient Gender: M          HR:  88 bpm. Exam Location:  [facility] Procedure: 2D Echo, Color Doppler and Cardiac Doppler Indications:    R06.00 Dyspnea on exertion  History:        Patient has no prior history of Echocardiogram examinations.                 CAD, Signs/Symptoms:DOE; Risk Factors:Hypertension, Dyslipidemia                 and Current Smoker.  Sonographer:    Charmayne Sheer Referring Phys: [id number] BRIAN AGBOR-ETANG IMPRESSIONS  1. Left ventricular ejection fraction, by estimation, is 65 to 70%. The left ventricle has normal function. The left ventricle has no regional wall motion abnormalities. Left ventricular diastolic parameters were normal.  2. Right ventricular systolic function is normal. The right ventricular size is normal.  3. The mitral valve is normal in structure. No evidence of mitral valve regurgitation.  4. The aortic valve is tricuspid. Aortic valve regurgitation is not visualized.  5. The inferior vena cava is normal in size with greater than 50% respiratory variability, suggesting right atrial pressure of 3 mmHg. FINDINGS  Left Ventricle: Left ventricular ejection fraction, by estimation, is 65 to 70%. The left ventricle has normal function. The left ventricle has no regional wall motion abnormalities. The left ventricular internal cavity size was normal in size. There is  no left ventricular hypertrophy. Left ventricular diastolic parameters were normal. Right Ventricle: The right ventricular size is normal. No increase in right ventricular wall thickness. Right ventricular systolic function is normal. Left Atrium: Left atrial size was normal in size. Right Atrium:  Right atrial size was normal in size. Pericardium: There is no evidence of pericardial effusion. Mitral Valve: The mitral valve is normal in structure. No evidence of mitral valve regurgitation. MV peak gradient, 5.3 mmHg. The mean mitral valve gradient is 3.0 mmHg. Tricuspid Valve: The tricuspid valve is normal in structure. Tricuspid valve regurgitation is not demonstrated. Aortic Valve: The aortic valve is tricuspid. Aortic valve regurgitation is not visualized. Aortic valve mean gradient measures 7.0 mmHg. Aortic valve peak gradient measures 12.8 mmHg. Aortic valve area, by VTI measures 3.94 cm. Pulmonic Valve: The pulmonic valve was not well visualized. Pulmonic valve regurgitation is not visualized. Aorta: The aortic root is normal in size and structure. Venous: The inferior vena cava is normal in size with greater than 50% respiratory variability, suggesting right atrial pressure of 3 mmHg. IAS/Shunts: No atrial level shunt detected by color flow Doppler.  LEFT VENTRICLE PLAX 2D LVIDd:         5.00 cm  Diastology LVIDs:         3.00 cm  LV e' medial:    10.20 cm/s LV PW:         0.90 cm  LV E/e' medial:  9.7 LV IVS:        0.80 cm  LV e' lateral:   7.94 cm/s LVOT diam:     2.30 cm  LV E/e' lateral: 12.5 LV SV:         123 LV SV Index:   64 LVOT Area:     4.15 cm  RIGHT VENTRICLE RV Basal diam:  3.00 cm LEFT ATRIUM             Index       RIGHT ATRIUM           Index LA diam:        3.80 cm 1.98 cm/m  RA Area:  13.00 cm LA Vol (A2C):   21.9 ml 11.40 ml/m RA Volume:   30.70 ml  15.98 ml/m LA Vol (A4C):   29.7 ml 15.46 ml/m LA Biplane Vol: 25.4 ml 13.22 ml/m  AORTIC VALVE                    PULMONIC VALVE AV Area (Vmax):    3.62 cm     PV Vmax:       1.67 m/s AV Area (Vmean):   3.77 cm     PV Vmean:      131.000 cm/s AV Area (VTI):     3.94 cm     PV VTI:        0.321 m AV Vmax:           179.00 cm/s  PV Peak grad:  11.2 mmHg AV Vmean:          120.000 cm/s PV Mean grad:  7.0 mmHg AV VTI:             0.311 m AV Peak Grad:      12.8 mmHg AV Mean Grad:      7.0 mmHg LVOT Vmax:         156.00 cm/s LVOT Vmean:        109.000 cm/s LVOT VTI:          0.295 m LVOT/AV VTI ratio: 0.95  AORTA Ao Root diam: 3.70 cm MITRAL VALVE MV Area (PHT): 4.39 cm    SHUNTS MV Area VTI:   5.30 cm    Systemic VTI:  0.30 m MV Peak grad:  5.3 mmHg    Systemic Diam: 2.30 cm MV Mean grad:  3.0 mmHg MV Vmax:       1.15 m/s MV Vmean:      82.6 cm/s MV Decel Time: 173 msec MV E velocity: 99.20 cm/s MV A velocity: 86.90 cm/s MV E/A ratio:  1.14 Kate Sable MD Electronically signed by Kate Sable MD Signature Date/Time: 04/19/2021/2:49:07 PM    Final       ASSESSMENT/PLAN   65 year old pleasant white male seen today for assessment for acute shortness of breath that happened several months ago Patient has chronic tobacco abuse currently smokes 1 pack a day also with a history of asbestos exposure with  calcified pleural plaques on CT scan as well as has a right middle lung mass that has calcified nodularity component At this time it is felt to be a benign process  At this time patient does not have any symptoms Patient does not want to obtain any pulmonary function testing Patient does not require any inhalers at this time No indication for antibiotics or prednisone at this time  Abnormal CT chest Patient is enrolled in the lung cancer screening program Follow-up CT chest in 1 year  Smoking cessation strongly advised    Patient satisfied with Plan of action and management. All questions answered Total time spent 45 minutes Follow-up in 1 year   Hilari Wethington Patricia Pesa, M.D.  [facility]  Medical Director [place] Director [facility] Cardio-Pulmonary Department

## 2021-06-19 ENCOUNTER — Telehealth: Payer: Self-pay | Admitting: Pharmacist

## 2021-06-19 NOTE — Telephone Encounter (Signed)
This patient is appearing on the insurance-provided list for being at risk of failing the adherence measure for hypertension (ACEi/ARB) medications this calendar year.   Medication: lisinopril - though appearing on report for 20 mg Last fill date: 05/24/21 (filled after report was provided to [place]).   Patient notes that his pharmacy continues to sometimes fill old script for lisinopril 20 mg, so he has been taking 2 20 mg tablets. Has follow up with PCP in 2 weeks, requests a refill of lisinopril 40 mg tablet strength at that time. I have also called the pharmacy and canceled the old prescription for lisinopril 20 mg

## 2021-06-27 ENCOUNTER — Other Ambulatory Visit: Payer: Self-pay

## 2021-06-27 ENCOUNTER — Encounter: Payer: Self-pay | Admitting: Family Medicine

## 2021-06-27 ENCOUNTER — Ambulatory Visit (INDEPENDENT_AMBULATORY_CARE_PROVIDER_SITE_OTHER): Payer: Medicare HMO | Admitting: Family Medicine

## 2021-06-27 DIAGNOSIS — R69 Illness, unspecified: Secondary | ICD-10-CM | POA: Diagnosis not present

## 2021-06-27 DIAGNOSIS — F17210 Nicotine dependence, cigarettes, uncomplicated: Secondary | ICD-10-CM

## 2021-06-27 DIAGNOSIS — I10 Essential (primary) hypertension: Secondary | ICD-10-CM

## 2021-06-27 DIAGNOSIS — L918 Other hypertrophic disorders of the skin: Secondary | ICD-10-CM

## 2021-06-27 DIAGNOSIS — Z708 Other sex counseling: Secondary | ICD-10-CM | POA: Insufficient documentation

## 2021-06-27 MED ORDER — LISINOPRIL 40 MG PO TABS: 40.0000 mg | ORAL_TABLET | Freq: Every day | ORAL | 1 refills | Status: DC

## 2021-06-27 NOTE — Assessment & Plan Note (Signed)
I had an extensive discussion with the patient regarding genital herpes.  He does not have any symptoms of this.  Discussed that there are 2 types of herpes and typically HSV-1 affects the oral area and HSV-2 affects the genital area though they can both affect both areas.  Advised that patient's with genital herpes to shed virus all the time though the viral shedding is higher when there is an outbreak of symptoms with rash or other symptoms.  I advised condom use.  The patient was advised not to have intercourse with his partner during an active outbreak of genital herpes in her.  He was advised to let us know if he developed any symptoms of genital herpes.  We discussed that it would be worthwhile having him confirm that his partner has had other STD testing.  I also discussed that there were medications that could be taken by people with genital herpes that could suppress their symptoms and lower their viral shedding though I advised that his partner would need to discuss whether or not those were good options for her with her physician.

## 2021-06-27 NOTE — Assessment & Plan Note (Signed)
Smoking cessation counseling was provided.  The patient is aware of why he should quit smoking.  He will either try cold [place] or nicotine supplementation.  He is currently thinking about quitting.

## 2021-06-27 NOTE — Assessment & Plan Note (Signed)
Well-controlled.  He will continue HCTZ 25 mg once daily and lisinopril 40 mg once daily.

## 2021-06-27 NOTE — Assessment & Plan Note (Signed)
Benign-appearing.  He declines dermatology evaluation at this time.  He will let us know when he would like to see dermatology for this.

## 2021-06-27 NOTE — Patient Instructions (Signed)
Nice to see you. When you are ready to see dermatology for your skin tags please let us know. Please try to quit smoking. I would encourage you to wear a condom as a prevention method to prevent the transmission of genital herpes.  You should avoid sexual activity during an active outbreak or if she has any symptoms consistent with genital herpes.  There are medications that people with herpes can take to help suppress their symptoms though your partner would need to discuss those with her physician to determine if those are appropriate for her.  If you develop any genital herpes symptoms please let me know as I can prescribe medication to treat the acute episode.  It would also be worthwhile confirming other STD testing in your partner.

## 2021-06-27 NOTE — Progress Notes (Signed)
Tommi Rumps, MD Phone: [phone]  William Yang is a 65 y.o. male who presents today for follow-up.  Skin tag: Patient notes this is on his right anterior neck.  It has been present for 6 to 8 months.  It is just there.  It does not get irritated.  Hypertension: Not checking blood pressures at home.  He is taking lisinopril and HCTZ.  No chest pain, shortness of breath, or edema.  Tobacco abuse: Patient continues to smoke though notes he may try quitting today.  He prefers to go cold [place] or try nicotine supplementation.  Genital herpes counseling: The patient reports his partner has genital herpes.  He does not want to wear a condom as it interferes with his ability to maintain an erection.  He wants to know what he should expect if he does get genital herpes.  He denies any issues with genital herpes.  Social History   Tobacco Use  Smoking Status Every Day   Packs/day: 1.00   Years: 45.00   Pack years: 45.00   Types: Cigarettes  Smokeless Tobacco Never    Current Outpatient Medications on File Prior to Visit  Medication Sig Dispense Refill   aspirin EC 81 MG tablet Take 1 tablet (81 mg total) by mouth daily. Swallow whole. 90 tablet 3   hydrochlorothiazide (HYDRODIURIL) 25 MG tablet TAKE ONE TABLET BY MOUTH DAILY 30 tablet 4   atorvastatin (LIPITOR) 40 MG tablet Take 1 tablet (40 mg total) by mouth daily. 30 tablet 5   No current facility-administered medications on file prior to visit.     ROS see history of present illness  Objective  Physical Exam Vitals:   06/27/21 1132  BP: 130/78  Pulse: 72  Temp: 98.5 F (36.9 C)  SpO2: 98%    BP Readings from Last 3 Encounters:  06/27/21 130/78  04/27/21 (!) 150/60  04/20/21 140/72   Wt Readings from Last 3 Encounters:  06/27/21 181 lb 1.9 oz (82.2 kg)  04/27/21 183 lb 9.6 oz (83.3 kg)  04/20/21 181 lb (82.1 kg)    Physical Exam Constitutional:      General: He is not in acute distress.    Appearance: He  is not diaphoretic.  Neck:   Cardiovascular:     Rate and Rhythm: Normal rate and regular rhythm.     Heart sounds: Normal heart sounds.  Pulmonary:     Effort: Pulmonary effort is normal.     Breath sounds: Normal breath sounds.  Skin:    General: Skin is warm and dry.  Neurological:     Mental Status: He is alert.     Assessment/Plan: Please see individual problem list.  Problem List Items Addressed This Visit     Essential hypertension (Chronic)    Well-controlled.  He will continue HCTZ 25 mg once daily and lisinopril 40 mg once daily.      Relevant Medications   lisinopril (ZESTRIL) 40 MG tablet   Nicotine dependence, cigarettes, uncomplicated (Chronic)    Smoking cessation counseling was provided.  The patient is aware of why he should quit smoking.  He will either try cold [place] or nicotine supplementation.  He is currently thinking about quitting.      Encounter for sexually transmitted disease counseling    I had an extensive discussion with the patient regarding genital herpes.  He does not have any symptoms of this.  Discussed that there are 2 types of herpes and typically HSV-1 affects the oral area and  HSV-2 affects the genital area though they can both affect both areas.  Advised that patient's with genital herpes to shed virus all the time though the viral shedding is higher when there is an outbreak of symptoms with rash or other symptoms.  I advised condom use.  The patient was advised not to have intercourse with his partner during an active outbreak of genital herpes in her.  He was advised to let us know if he developed any symptoms of genital herpes.  We discussed that it would be worthwhile having him confirm that his partner has had other STD testing.  I also discussed that there were medications that could be taken by people with genital herpes that could suppress their symptoms and lower their viral shedding though I advised that his partner would need to  discuss whether or not those were good options for her with her physician.      Skin tag    Benign-appearing.  He declines dermatology evaluation at this time.  He will let us know when he would like to see dermatology for this.       Return in about 6 months (around 12/25/2021) for Hypertension.  This visit occurred during the SARS-CoV-2 public health emergency.  Safety protocols were in place, including screening questions prior to the visit, additional usage of staff PPE, and extensive cleaning of exam room while observing appropriate contact time as indicated for disinfecting solutions.    Tommi Rumps, MD [place]

## 2021-06-28 ENCOUNTER — Other Ambulatory Visit: Payer: Self-pay | Admitting: *Deleted

## 2021-06-28 MED ORDER — ATORVASTATIN CALCIUM 40 MG PO TABS: 40.0000 mg | ORAL_TABLET | Freq: Every day | ORAL | 4 refills | Status: DC

## 2021-07-12 DIAGNOSIS — H524 Presbyopia: Secondary | ICD-10-CM | POA: Diagnosis not present

## 2021-07-18 ENCOUNTER — Telehealth: Payer: Self-pay | Admitting: Family Medicine

## 2021-07-18 ENCOUNTER — Telehealth: Payer: Self-pay | Admitting: Nurse Practitioner

## 2021-07-18 NOTE — Telephone Encounter (Signed)
Pt called in stating that he wasn't feeling well on Friday 07/14/2021 but Pt thought he may had came down with the flu. Pt stated that Saturday 09/14/2020 someone encourage him to take a Covid test,Pt did an @ home test and the result came back positive. Pt stated on Saturday he still wasn't feeling well. Pt stated that Sunday & Monday he was feeling weak and still felt bad. Pt stated today he is experiencing no taste or smell, feel weak, no cough or congestion. No avail appt with providers. Reached out to access nurse. Spoke with Judson Roch from access nurse she took all Pt information and advise me to let Pt know that there will be a 60 min return callback time.

## 2021-07-18 NOTE — Telephone Encounter (Signed)
I called the patient to schedule a virtual visit with William Yang and his mailbox was full.  [place] Hair,cma

## 2021-07-19 NOTE — Telephone Encounter (Signed)
I called and spoke with the patient to see how he was feeling and he stated he is much better today, his taste and smell has returned, no fever and he feel great, I informed him to be quarantined for 5 days from the day his symptoms started and after those 5 days when going out wear a mask for 5 more days and he understood.  Cartier Mapel,cma

## 2021-07-26 ENCOUNTER — Ambulatory Visit: Payer: Medicare HMO | Admitting: Family Medicine

## 2021-08-19 ENCOUNTER — Other Ambulatory Visit: Payer: Self-pay | Admitting: Family Medicine

## 2021-10-20 ENCOUNTER — Encounter: Payer: Self-pay | Admitting: Family Medicine

## 2021-11-08 DIAGNOSIS — Z1211 Encounter for screening for malignant neoplasm of colon: Secondary | ICD-10-CM | POA: Diagnosis not present

## 2021-11-16 LAB — COLOGUARD: COLOGUARD: NEGATIVE

## 2021-12-20 ENCOUNTER — Other Ambulatory Visit: Payer: Self-pay | Admitting: Family Medicine

## 2021-12-20 DIAGNOSIS — I10 Essential (primary) hypertension: Secondary | ICD-10-CM

## 2021-12-27 ENCOUNTER — Ambulatory Visit: Payer: Medicare HMO

## 2021-12-28 ENCOUNTER — Ambulatory Visit (INDEPENDENT_AMBULATORY_CARE_PROVIDER_SITE_OTHER): Payer: Medicare HMO

## 2021-12-28 VITALS — Ht 66.0 in | Wt 181.0 lb

## 2021-12-28 DIAGNOSIS — Z0000 Encounter for general adult medical examination without abnormal findings: Secondary | ICD-10-CM | POA: Diagnosis not present

## 2021-12-28 NOTE — Patient Instructions (Addendum)
?  William Yang , ?Thank you for taking time to come for your Medicare Wellness Visit. I appreciate your ongoing commitment to your health goals. Please review the following plan we discussed and let me know if I can assist you in the future.  ? ?These are the goals we discussed: ? Goals   ? ?  Increase physical activity   ?  Walk for exercise ?  ? ?  ?  ?This is a list of the screening recommended for you and due dates:  ?Health Maintenance  ?Topic Date Due  ? COVID-19 Vaccine (2 - Janssen risk series) 01/13/2022*  ? Pneumonia Vaccine (2 - PCV) 02/17/2022*  ? Flu Shot  03/20/2022  ? Cologuard (Stool DNA test)  11/08/2024  ? Tetanus Vaccine  09/20/2027  ? Hepatitis C Screening: USPSTF Recommendation to screen - Ages 27-79 yo.  Completed  ? Zoster (Shingles) Vaccine  Completed  ? HPV Vaccine  Aged Out  ?*Topic was postponed. The date shown is not the original due date.  ?  ?

## 2021-12-28 NOTE — Progress Notes (Signed)
Subjective:   Dreydan Sotto is a 66 y.o. male who presents for an Initial Medicare Annual Wellness Visit.  Review of Systems    No ROS.  Medicare Wellness Virtual Visit.  Visual/audio telehealth visit, UTA vital signs.   See social history for additional risk factors.   Cardiac Risk Factors include: advanced age (>62men, >96 women);male gender     Objective:    Today's Vitals   12/28/21 0835  Weight: 181 lb (82.1 kg)  Height: 5\' 6"  (1.676 m)   Body mass index is 29.21 kg/m.     12/28/2021    8:21 AM 08/05/2020    2:21 PM 08/06/2019    2:17 PM 08/05/2018    1:05 PM 07/10/2018    2:52 PM  Advanced Directives  Does Patient Have a Medical Advance Directive? Yes No No Yes No  Type of Estate agent of [place];Living will   Healthcare Power of [place];Living will   Does patient want to make changes to medical advance directive? No - Patient declined      Copy of Healthcare Power of Attorney in Chart? No - copy requested   No - copy requested   Would patient like information on creating a medical advance directive?   No - Patient declined  No - Patient declined    Current Medications (verified) Outpatient Encounter Medications as of 12/28/2021  Medication Sig   aspirin EC 81 MG tablet Take 1 tablet (81 mg total) by mouth daily. Swallow whole.   atorvastatin (LIPITOR) 40 MG tablet Take 1 tablet (40 mg total) by mouth daily.   hydrochlorothiazide (HYDRODIURIL) 25 MG tablet TAKE ONE TABLET BY MOUTH DAILY   lisinopril (ZESTRIL) 40 MG tablet TAKE ONE TABLET BY MOUTH DAILY   No facility-administered encounter medications on file as of 12/28/2021.    Allergies (verified) Patient has no known allergies.   History: Past Medical History:  Diagnosis Date   Chickenpox    Hyperlipidemia    Hypertension    Past Surgical History:  Procedure Laterality Date   APPENDECTOMY  1964   Family History  Problem Relation Age of Onset   Alcoholism Other         Parent   Lung cancer Brother    Hypertension Other        Parent   Social History   Socioeconomic History   Marital status: Married    Spouse name: Not on file   Number of children: Not on file   Years of education: Not on file   Highest education level: Not on file  Occupational History   Not on file  Tobacco Use   Smoking status: Every Day    Packs/day: 1.00    Years: 45.00    Pack years: 45.00    Types: Cigarettes   Smokeless tobacco: Never  Vaping Use   Vaping Use: Never used  Substance and Sexual Activity   Alcohol use: Yes    Alcohol/week: 21.0 standard drinks    Types: 21 Standard drinks or equivalent per week    Comment: 21 beers a week    Drug use: No   Sexual activity: Not on file  Other Topics Concern   Not on file  Social History Narrative   Not on file   Social Determinants of Health   Financial Resource Strain: Low Risk    Difficulty of Paying Living Expenses: Not hard at all  Food Insecurity: No Food Insecurity   Worried About Programme researcher, broadcasting/film/video  in the Last Year: Never true   Ran Out of Food in the Last Year: Never true  Transportation Needs: No Transportation Needs   Lack of Transportation (Medical): No   Lack of Transportation (Non-Medical): No  Physical Activity: Insufficiently Active   Days of Exercise per Week: 4 days   Minutes of Exercise per Session: 30 min  Stress: No Stress Concern Present   Feeling of Stress : Not at all  Social Connections: Unknown   Frequency of Communication with Friends and Family: More than three times a week   Frequency of Social Gatherings with Friends and Family: More than three times a week   Attends Religious Services: Not on Scientist, clinical (histocompatibility and immunogenetics) or Organizations: Not on file   Attends Banker Meetings: Not on file   Marital Status: Not on file    Tobacco Counseling Ready to quit: Not Answered Counseling given: Not Answered   Clinical Intake:  Pre-visit preparation completed:  Yes        Diabetes: No  How often do you need to have someone help you when you read instructions, pamphlets, or other written materials from your doctor or pharmacy?: 1 - Never    Interpreter Needed?: No      Activities of Daily Living    12/28/2021    8:25 AM  In your present state of health, do you have any difficulty performing the following activities:  Hearing? 0  Vision? 0  Difficulty concentrating or making decisions? 0  Walking or climbing stairs? 0  Dressing or bathing? 0  Doing errands, shopping? 0  Preparing Food and eating ? N  Using the Toilet? N  In the past six months, have you accidently leaked urine? N  Do you have problems with loss of bowel control? N  Managing your Medications? N  Managing your Finances? N  Housekeeping or managing your Housekeeping? N    Patient Care Team: Glori Luis, MD as PCP - General (Family Medicine) Debbe Odea, MD as PCP - Cardiology (Cardiology) Creig Hines, MD as Consulting Physician (Oncology)  Indicate any recent Medical Services you may have received from other than [facility] providers in the past year (date may be approximate).     Assessment:   This is a routine wellness examination for Orvie.  Virtual Visit via Telephone Note  I connected with  [name] on 12/28/21 at  8:15 AM EDT by telephone and verified that I am speaking with the correct person using two identifiers.  Persons participating in the virtual visit: patient/Nurse Health Advisor   I discussed the limitations of performing an evaluation and management service by telehealth.  We continued and completed visit with audio only.Some vital signs may be absent or patient reported.   Hearing/Vision screen Hearing Screening - Comments:: Patient is able to hear conversational tones without difficulty.  No issues reported. Vision Screening - Comments:: Followed by [facility] Wears corrective lenses They have seen their ophthalmologist  in the last 12 months.    Dietary issues and exercise activities discussed: Current Exercise Habits: Home exercise routine, Type of exercise: walking, Intensity: Mild Regular diet    Goals Addressed             This Visit's Progress    Increase physical activity       Walk for exercise       Depression Screen    12/28/2021    8:25 AM 06/27/2021   11:41 AM 01/19/2021  11:25 AM 10/24/2020    9:31 AM 06/20/2020   10:28 AM 03/29/2017    3:52 PM  PHQ 2/9 Scores  PHQ - 2 Score 0 0 0 0 0 0    Fall Risk    12/28/2021    8:24 AM 06/27/2021   11:41 AM 01/19/2021   11:25 AM 10/24/2020    9:31 AM 06/20/2020   10:27 AM  Fall Risk   Falls in the past year? 0 0 0 0 0  Number falls in past yr: 0 0 0 0 0  Injury with Fall?  0 0    Risk for fall due to :  No Fall Risks     Follow up Falls evaluation completed Falls evaluation completed Falls evaluation completed Falls evaluation completed Falls evaluation completed    FALL RISK PREVENTION PERTAINING TO THE HOME: Home free of loose throw rugs in walkways, pet beds, electrical cords, etc? Yes  Adequate lighting in your home to reduce risk of falls? Yes   ASSISTIVE DEVICES UTILIZED TO PREVENT FALLS: Life alert? No  Use of a cane, walker or w/c? No   TIMED UP AND GO: Was the test performed? No .   Cognitive Function:  Patient is alert and oriented x3.       12/28/2021    8:26 AM  6CIT Screen  What Year? 0 points  What month? 0 points  What time? 0 points  Count back from 20 0 points  Months in reverse 0 points  Repeat phrase 0 points  Total Score 0 points    Immunizations Immunization History  Administered Date(s) Administered   Influenza,inj,Quad PF,6+ Mos 06/25/2018   Influenza-Unspecified 05/21/2019, 05/20/2020, 06/07/2020   Janssen (J&J) SARS-COV-2 Vaccination 11/26/2019   Pneumococcal Polysaccharide-23 09/19/2017   Tdap 11/17/2015, 09/19/2017   Zoster Recombinat (Shingrix) 05/21/2019, 08/21/2019   Zoster, Live  11/17/2015   Pneumococcal vaccine status: Due, Education has been provided regarding the importance of this vaccine. Advised may receive this vaccine at local pharmacy or Health Dept. Aware to provide a copy of the vaccination record if obtained from local pharmacy or Health Dept. Verbalized acceptance and understanding.  Screening Tests Health Maintenance  Topic Date Due   COVID-19 Vaccine (2 - Janssen risk series) 01/13/2022 (Originally 12/24/2019)   Pneumonia Vaccine 67+ Years old (2 - PCV) 02/17/2022 (Originally 09/19/2018)   INFLUENZA VACCINE  03/20/2022   Fecal DNA (Cologuard)  11/08/2024   TETANUS/TDAP  09/20/2027   Hepatitis C Screening  Completed   Zoster Vaccines- Shingrix  Completed   HPV VACCINES  Aged Out   Health Maintenance There are no preventive care reminders to display for this patient.  Cologuard results given per patient request. Negative result.   Vision Screening: Recommended annual ophthalmology exams for early detection of glaucoma and other disorders of the eye.  Dental Screening: Recommended annual dental exams for proper oral hygiene  Community Resource Referral / Chronic Care Management: CRR required this visit?  No   CCM required this visit?  No      Plan:   Keep all routine maintenance appointments.   I have personally reviewed and noted the following in the patient's chart:   Medical and social history Use of alcohol, tobacco or illicit drugs  Current medications and supplements including opioid prescriptions. Patient is not currently taking opioid prescriptions. Functional ability and status Nutritional status Physical activity Advanced directives List of other physicians Hospitalizations, surgeries, and ER visits in previous 12 months Vitals Screenings to include cognitive,  depression, and falls Referrals and appointments  In addition, I have reviewed and discussed with patient certain preventive protocols, quality metrics, and best  practice recommendations. A written personalized care plan for preventive services as well as general preventive health recommendations were provided to patient.     Ashok Pall, LPN   1/61/0960

## 2022-01-16 ENCOUNTER — Encounter: Payer: Self-pay | Admitting: Family Medicine

## 2022-01-16 ENCOUNTER — Ambulatory Visit (INDEPENDENT_AMBULATORY_CARE_PROVIDER_SITE_OTHER): Payer: Medicare HMO | Admitting: Family Medicine

## 2022-01-16 ENCOUNTER — Other Ambulatory Visit (INDEPENDENT_AMBULATORY_CARE_PROVIDER_SITE_OTHER): Payer: Medicare HMO

## 2022-01-16 DIAGNOSIS — E785 Hyperlipidemia, unspecified: Secondary | ICD-10-CM | POA: Diagnosis not present

## 2022-01-16 DIAGNOSIS — F17210 Nicotine dependence, cigarettes, uncomplicated: Secondary | ICD-10-CM

## 2022-01-16 DIAGNOSIS — I10 Essential (primary) hypertension: Secondary | ICD-10-CM

## 2022-01-16 DIAGNOSIS — N529 Male erectile dysfunction, unspecified: Secondary | ICD-10-CM

## 2022-01-16 DIAGNOSIS — R911 Solitary pulmonary nodule: Secondary | ICD-10-CM

## 2022-01-16 DIAGNOSIS — R69 Illness, unspecified: Secondary | ICD-10-CM | POA: Diagnosis not present

## 2022-01-16 MED ORDER — SILDENAFIL CITRATE 50 MG PO TABS: 50.0000 mg | ORAL_TABLET | Freq: Every day | ORAL | 2 refills | Status: DC | PRN

## 2022-01-16 NOTE — Assessment & Plan Note (Signed)
Continue Lipitor 40 mg once daily.  Check lipid panel.

## 2022-01-16 NOTE — Assessment & Plan Note (Signed)
Viagra has been beneficial.  Refill provided today.  He was advised that he would need to let any medical provider he encountered through EMS or the emergency department know if he had taken this medication.  Discussed cessation of sexual activity if he develop chest pain or excessive shortness of breath.

## 2022-01-16 NOTE — Assessment & Plan Note (Signed)
Smoking cessation counseling provided.  The patient is ready to quit.  He was encouraged to pick a quit date.  I discussed treatment strategies including nicotine replacement versus Wellbutrin versus Chantix.  The patient opted for nicotine replacement.  Refer for lung cancer screening.

## 2022-01-16 NOTE — Assessment & Plan Note (Signed)
Well-controlled.  He will continue lisinopril 40 mg daily and HCTZ 25 mg daily.

## 2022-01-16 NOTE — Progress Notes (Signed)
Virtual Visit via telephone Note  This visit type was conducted due to national recommendations for restrictions regarding the COVID-19 pandemic (e.g. social distancing).  This format is felt to be most appropriate for this patient at this time.  All issues noted in this document were discussed and addressed.  No physical exam was performed (except for noted visual exam findings with Video Visits).   I connected with William Yang today at 12:00 PM EDT by telephone and verified that I am speaking with the correct person using two identifiers. Location patient: [place], [place] Location provider: home office Persons participating in the virtual visit: patient, provider  I discussed the limitations, risks, security and privacy concerns of performing an evaluation and management service by telephone and the availability of in person appointments. I also discussed with the patient that there may be a patient responsible charge related to this service. The patient expressed understanding and agreed to proceed.  Interactive audio and video telecommunications were attempted between this provider and patient, however failed, due to patient having technical difficulties OR patient did not have access to video capability.  We continued and completed visit with audio only.   Reason for visit: f/u.  HPI: HYPERTENSION Disease Monitoring Home BP Monitoring 130/76 Chest pain- no    Dyspnea- no Medications Compliance-  taking lisinopril, HCTZ.   Edema- no BMET    Component Value Date/Time   NA 135 03/24/2021 0918   NA 142 09/19/2017 0000   K 4.5 03/24/2021 0918   CL 102 03/24/2021 0918   CO2 25 03/24/2021 0918   GLUCOSE 83 03/24/2021 0918   BUN 14 03/24/2021 0918   BUN 17 09/19/2017 0000   CREATININE 1.07 03/24/2021 0918   CREATININE 1.15 02/19/2017 1625   CALCIUM 9.3 03/24/2021 0918   HYPERLIPIDEMIA Symptoms Chest pain on exertion:  no   Leg claudication:   no Medications: Compliance- taking  lipitor Right upper quadrant pain- no  Muscle aches- no Lipid Panel     Component Value Date/Time   CHOL 170 06/20/2020 1056   TRIG 205.0 (H) 06/20/2020 1056   HDL 53.80 06/20/2020 1056   CHOLHDL 3 06/20/2020 1056   VLDL 41.0 (H) 06/20/2020 1056   LDLCALC 67 06/25/2018 1629   LDLDIRECT 94.0 06/20/2020 1056   Right knee soreness: Patient notes this occurred last week.  It has gradually gotten better and today feels back to normal.  He noted no injury.  Nicotine dependence: He continues to smoke.  He is ready to quit.  He notes he was successful with nicotine gum in the past.  He did take Chantix and notes he was able to quit then though started back after his father-in-law got sick.   ROS: See pertinent positives and negatives per HPI.  Past Medical History:  Diagnosis Date   Chickenpox    Hyperlipidemia    Hypertension     Past Surgical History:  Procedure Laterality Date   APPENDECTOMY  1964    Family History  Problem Relation Age of Onset   Alcoholism Other        Parent   Lung cancer Brother    Hypertension Other        Parent    SOCIAL HX: Smoker   Current Outpatient Medications:    aspirin EC 81 MG tablet, Take 1 tablet (81 mg total) by mouth daily. Swallow whole., Disp: 90 tablet, Rfl: 3   atorvastatin (LIPITOR) 40 MG tablet, Take 1 tablet (40 mg total) by mouth daily.,  Disp: 30 tablet, Rfl: 4   hydrochlorothiazide (HYDRODIURIL) 25 MG tablet, TAKE ONE TABLET BY MOUTH DAILY, Disp: 90 tablet, Rfl: 1   lisinopril (ZESTRIL) 40 MG tablet, TAKE ONE TABLET BY MOUTH DAILY, Disp: 90 tablet, Rfl: 1   sildenafil (VIAGRA) 50 MG tablet, Take 1 tablet (50 mg total) by mouth daily as needed for erectile dysfunction., Disp: 5 tablet, Rfl: 2  EXAM: This was a telephone visit and thus no exam was completed.  ASSESSMENT AND PLAN:  Discussed the following assessment and plan:  Problem List Items Addressed This Visit     Erectile dysfunction (Chronic)    Viagra has been  beneficial.  Refill provided today.  He was advised that he would need to let any medical provider he encountered through EMS or the emergency department know if he had taken this medication.  Discussed cessation of sexual activity if he develop chest pain or excessive shortness of breath.       Relevant Medications   sildenafil (VIAGRA) 50 MG tablet   Essential hypertension (Chronic)    Well-controlled.  He will continue lisinopril 40 mg daily and HCTZ 25 mg daily.       Relevant Medications   sildenafil (VIAGRA) 50 MG tablet   Other Relevant Orders   Comp Met (CMET)   Hyperlipidemia (Chronic)    Continue Lipitor 40 mg once daily.  Check lipid panel.       Relevant Medications   sildenafil (VIAGRA) 50 MG tablet   Other Relevant Orders   Comp Met (CMET)   Lipid panel   Lung nodule (Chronic)    Patient is due for follow-up lung cancer screening.  Referral placed.       Relevant Orders   Ambulatory Referral Lung Cancer Screening [facility]   Nicotine dependence, cigarettes, uncomplicated (Chronic)    Smoking cessation counseling provided.  The patient is ready to quit.  He was encouraged to pick a quit date.  I discussed treatment strategies including nicotine replacement versus Wellbutrin versus Chantix.  The patient opted for nicotine replacement.  Refer for lung cancer screening.       Relevant Orders   Ambulatory Referral Lung Cancer Screening [facility]    Return in about 6 months (around 07/19/2022).   I discussed the assessment and treatment plan with the patient. The patient was provided an opportunity to ask questions and all were answered. The patient agreed with the plan and demonstrated an understanding of the instructions.   The patient was advised to call back or seek an in-person evaluation if the symptoms worsen or if the condition fails to improve as anticipated.  I provided 11 minutes of non-face-to-face time during this  encounter.   Tommi Rumps, MD

## 2022-01-16 NOTE — Assessment & Plan Note (Signed)
Patient is due for follow-up lung cancer screening. Referral placed.  

## 2022-01-17 LAB — LIPID PANEL
Cholesterol: 138 mg/dL (ref 0–200)
HDL: 49.1 mg/dL (ref >39.00)
LDL Cholesterol: 51 mg/dL (ref 0–99)
NonHDL: 89.11
Total CHOL/HDL Ratio: 3
Triglycerides: 193 mg/dL — ABNORMAL HIGH (ref 0.0–149.0)
VLDL: 38.6 mg/dL (ref 0.0–40.0)

## 2022-01-17 LAB — COMPREHENSIVE METABOLIC PANEL
ALT: 16 U/L (ref 0–53)
AST: 17 U/L (ref 0–37)
Albumin: 4.1 g/dL (ref 3.5–5.2)
Alkaline Phosphatase: 64 U/L (ref 39–117)
BUN: 19 mg/dL (ref 6–23)
CO2: 24 mEq/L (ref 19–32)
Calcium: 9.2 mg/dL (ref 8.4–10.5)
Chloride: 102 mEq/L (ref 96–112)
Creatinine, Ser: 1.49 mg/dL (ref 0.40–1.50)
GFR: 48.52 mL/min — ABNORMAL LOW (ref >60.00)
Glucose, Bld: 92 mg/dL (ref 70–99)
Potassium: 4 mEq/L (ref 3.5–5.1)
Sodium: 136 mEq/L (ref 135–145)
Total Bilirubin: 0.5 mg/dL (ref 0.2–1.2)
Total Protein: 6.6 g/dL (ref 6.0–8.3)

## 2022-01-18 ENCOUNTER — Other Ambulatory Visit: Payer: Self-pay

## 2022-01-18 DIAGNOSIS — I10 Essential (primary) hypertension: Secondary | ICD-10-CM

## 2022-02-02 ENCOUNTER — Telehealth: Payer: Self-pay | Admitting: Acute Care

## 2022-02-02 NOTE — Telephone Encounter (Signed)
Attempted to reach pt to schedule annual LDCT-attempted to leave voicemail message, but mailbox was full.

## 2022-02-05 ENCOUNTER — Other Ambulatory Visit: Payer: Self-pay | Admitting: *Deleted

## 2022-02-05 ENCOUNTER — Other Ambulatory Visit (INDEPENDENT_AMBULATORY_CARE_PROVIDER_SITE_OTHER): Payer: Medicare HMO

## 2022-02-05 DIAGNOSIS — I10 Essential (primary) hypertension: Secondary | ICD-10-CM | POA: Diagnosis not present

## 2022-02-05 LAB — BASIC METABOLIC PANEL
BUN: 10 mg/dL (ref 6–23)
CO2: 25 mEq/L (ref 19–32)
Calcium: 9.1 mg/dL (ref 8.4–10.5)
Chloride: 102 mEq/L (ref 96–112)
Creatinine, Ser: 0.98 mg/dL (ref 0.40–1.50)
GFR: 80.19 mL/min (ref >60.00)
Glucose, Bld: 106 mg/dL — ABNORMAL HIGH (ref 70–99)
Potassium: 4.3 mEq/L (ref 3.5–5.1)
Sodium: 135 mEq/L (ref 135–145)

## 2022-02-05 MED ORDER — ATORVASTATIN CALCIUM 40 MG PO TABS: 40.0000 mg | ORAL_TABLET | Freq: Every day | ORAL | 3 refills | Status: DC

## 2022-02-24 DIAGNOSIS — H168 Other keratitis: Secondary | ICD-10-CM | POA: Diagnosis not present

## 2022-02-25 ENCOUNTER — Other Ambulatory Visit: Payer: Self-pay | Admitting: Family Medicine

## 2022-02-26 DIAGNOSIS — H168 Other keratitis: Secondary | ICD-10-CM | POA: Diagnosis not present

## 2022-03-12 NOTE — Telephone Encounter (Signed)
Attempted to contact to schedule annual LDCT.  No answer and voicemail is full and not accepting messages

## 2022-03-26 ENCOUNTER — Telehealth: Payer: Self-pay | Admitting: Cardiology

## 2022-03-26 NOTE — Telephone Encounter (Signed)
No VM  

## 2022-03-26 NOTE — Telephone Encounter (Signed)
-----   Message from Quentin Angst, [place] sent at 03/26/2022 11:21 AM EDT ----- Regarding: 1 year follow up Hi,  Could you schedule this patient a 1 year follow up? They were last seen by Dr. Mylo Red 04-20-2021 and he requested for the patient to follow up in a year.   Thank you so much, Jama Flavors

## 2022-06-14 ENCOUNTER — Other Ambulatory Visit: Payer: Self-pay | Admitting: Family Medicine

## 2022-06-14 DIAGNOSIS — I10 Essential (primary) hypertension: Secondary | ICD-10-CM

## 2022-07-20 ENCOUNTER — Encounter: Payer: Self-pay | Admitting: Family Medicine

## 2022-07-20 ENCOUNTER — Ambulatory Visit (INDEPENDENT_AMBULATORY_CARE_PROVIDER_SITE_OTHER): Payer: Medicare HMO | Admitting: Family Medicine

## 2022-07-20 VITALS — BP 126/80 | HR 68 | Temp 97.8°F | Ht 66.0 in | Wt 187.4 lb

## 2022-07-20 DIAGNOSIS — I10 Essential (primary) hypertension: Secondary | ICD-10-CM

## 2022-07-20 DIAGNOSIS — N529 Male erectile dysfunction, unspecified: Secondary | ICD-10-CM

## 2022-07-20 DIAGNOSIS — R911 Solitary pulmonary nodule: Secondary | ICD-10-CM

## 2022-07-20 DIAGNOSIS — F17210 Nicotine dependence, cigarettes, uncomplicated: Secondary | ICD-10-CM

## 2022-07-20 DIAGNOSIS — R69 Illness, unspecified: Secondary | ICD-10-CM | POA: Diagnosis not present

## 2022-07-20 DIAGNOSIS — E785 Hyperlipidemia, unspecified: Secondary | ICD-10-CM | POA: Diagnosis not present

## 2022-07-20 DIAGNOSIS — L989 Disorder of the skin and subcutaneous tissue, unspecified: Secondary | ICD-10-CM

## 2022-07-20 LAB — BASIC METABOLIC PANEL
BUN: 19 mg/dL (ref 6–23)
CO2: 27 mEq/L (ref 19–32)
Calcium: 9.3 mg/dL (ref 8.4–10.5)
Chloride: 101 mEq/L (ref 96–112)
Creatinine, Ser: 0.95 mg/dL (ref 0.40–1.50)
GFR: 82.98 mL/min (ref >60.00)
Glucose, Bld: 105 mg/dL — ABNORMAL HIGH (ref 70–99)
Potassium: 4.1 mEq/L (ref 3.5–5.1)
Sodium: 137 mEq/L (ref 135–145)

## 2022-07-20 MED ORDER — SILDENAFIL CITRATE 100 MG PO TABS: 100.0000 mg | ORAL_TABLET | Freq: Every day | ORAL | 3 refills | Status: DC | PRN

## 2022-07-20 NOTE — Assessment & Plan Note (Signed)
Stable. Encouraged patient to take his medications daily and check pressures at home. He will continue lisinopril 40 mg daily and HCTZ 25 mg daily.

## 2022-07-20 NOTE — Progress Notes (Signed)
Patient seen along with medical student Marisa Cyphers.  I personally evaluated this patient along with the student, and verified all aspects of the history, physical exam, and medical decision making as documented by the student.  I agree with the student's documentation and have made all necessary edits.  Additionally the patient wanted to discuss his Viagra with just me.  He notes his erectile dysfunction is generally stable.  At times he is not able to get a full erection and keep it.  He wonders about trying a higher dose of Viagra.  He also noticed some skin lesions on his face that he would like to see his dermatologist for.  Patient has several benign-appearing papules on his right cheek and forehead.  Viagra 100 mg daily sent to pharmacy.  Dermatology referral placed.  Tommi Rumps, MD

## 2022-07-20 NOTE — Assessment & Plan Note (Signed)
Stable. Continue taking Lipitor 40 mg once daily.

## 2022-07-20 NOTE — Patient Instructions (Addendum)
Nice to see you. Sent the higher dose of Viagra in for you.  If this is not beneficial please let me know. Dermatology should contact you to schedule an appointment. The lung cancer screening people should contact you as well. Please try to pick a quit date when you are ready to quit smoking and stick with it.

## 2022-07-20 NOTE — Progress Notes (Signed)
Tommi Rumps, MD Phone: [phone]  William Yang is a 66 y.o. male who presents today for f/u.  Hypertension: patient reports that he has a cuff at home but doesn't check his pressures. He states that he sometimes misses his lisinopril and hydrochlorothiazide doses, up to 2-3 times a week. He states that he doesn't take the medications at a regular time, and takes them interchangeably at daytime and nighttime. He denies chest pain and shortness of breath.   Hyperlipidemia: patient reports that he sometimes misses his atorvastatin dose in the same manner as his hypertension medications above.  Smoking cessation: patient is currently not ready to quit and states that he previously quit before, but he started again after the loss of his wife.    Social History   Tobacco Use  Smoking Status Every Day   Packs/day: 1.00   Years: 45.00   Total pack years: 45.00   Types: Cigarettes  Smokeless Tobacco Never    Current Outpatient Medications on File Prior to Visit  Medication Sig Dispense Refill   atorvastatin (LIPITOR) 40 MG tablet Take 1 tablet (40 mg total) by mouth daily. 30 tablet 3   hydrochlorothiazide (HYDRODIURIL) 25 MG tablet TAKE ONE TABLET BY MOUTH DAILY 90 tablet 1   lisinopril (ZESTRIL) 40 MG tablet TAKE ONE TABLET BY MOUTH DAILY 90 tablet 1   aspirin EC 81 MG tablet Take 1 tablet (81 mg total) by mouth daily. Swallow whole. (Patient not taking: Reported on 07/20/2022) 90 tablet 3   No current facility-administered medications on file prior to visit.     ROS see history of present illness  Objective  Vitals:   07/20/22 0904 07/20/22 0931  BP: 130/82 126/80  Pulse: 68   Temp: 97.8 F (36.6 C)   SpO2: 99%     BP Readings from Last 3 Encounters:  07/20/22 126/80  06/27/21 130/78  04/27/21 (!) 150/60   Wt Readings from Last 3 Encounters:  07/20/22 187 lb 6.4 oz (85 kg)  01/16/22 183 lb (83 kg)  12/28/21 181 lb (82.1 kg)    Physical Exam Constitutional:       Appearance: Normal appearance.  HENT:     Head: Normocephalic and atraumatic.  Cardiovascular:     Rate and Rhythm: Normal rate and regular rhythm.  Pulmonary:     Effort: Pulmonary effort is normal.     Breath sounds: Normal breath sounds.  Skin:    General: Skin is warm and dry.  Neurological:     Mental Status: He is alert.      Assessment/Plan: Please see individual problem list.  Problem List Items Addressed This Visit       Cardiovascular and Mediastinum   Essential hypertension - Primary (Chronic)    Stable. Encouraged patient to take his medications daily and check pressures at home. He will continue lisinopril 40 mg daily and HCTZ 25 mg daily.       Relevant Medications   sildenafil (VIAGRA) 100 MG tablet   Other Relevant Orders   Basic Metabolic Panel (BMET)     Respiratory   Lung nodule (Chronic)    Patient is due for follow-up lung cancer screening. Referral placed.         Other   Hyperlipidemia (Chronic)    Stable. Continue taking Lipitor 40 mg once daily.       Relevant Medications   sildenafil (VIAGRA) 100 MG tablet   Erectile dysfunction (Chronic)   Relevant Medications   sildenafil (VIAGRA) 100 MG  tablet   Nicotine dependence, cigarettes, uncomplicated (Chronic)    Counseled patient on smoking cessation. The patient is not ready to quit at this time. Refer for lung cancer screening.       Relevant Orders   Ambulatory Referral for Lung Cancer Scre   Other Visit Diagnoses     Skin lesion       Relevant Orders   Ambulatory referral to Dermatology       Health Maintenance: patient refused flu shot today, stating it makes him have flu-like symptoms.  Lung cancer screening: He states that he had a CT scan done May last year and it was benign. 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   Return in about 6 months (around 01/19/2023) for CPE.   William Yang,  Medical Student  [place]

## 2022-07-20 NOTE — Assessment & Plan Note (Signed)
Patient is due for follow-up lung cancer screening. Referral placed.

## 2022-07-20 NOTE — Assessment & Plan Note (Signed)
Counseled patient on smoking cessation. The patient is not ready to quit at this time. Refer for lung cancer screening.

## 2022-07-30 DIAGNOSIS — H5203 Hypermetropia, bilateral: Secondary | ICD-10-CM | POA: Diagnosis not present

## 2022-08-24 ENCOUNTER — Other Ambulatory Visit: Payer: Self-pay | Admitting: Family Medicine

## 2022-10-19 ENCOUNTER — Other Ambulatory Visit: Payer: Self-pay

## 2022-10-19 DIAGNOSIS — F17210 Nicotine dependence, cigarettes, uncomplicated: Secondary | ICD-10-CM

## 2022-10-19 DIAGNOSIS — Z87891 Personal history of nicotine dependence: Secondary | ICD-10-CM

## 2022-10-19 DIAGNOSIS — Z122 Encounter for screening for malignant neoplasm of respiratory organs: Secondary | ICD-10-CM

## 2022-11-05 ENCOUNTER — Ambulatory Visit
Admission: RE | Admit: 2022-11-05 | Discharge: 2022-11-05 | Disposition: A | Discharge status: Home / Self Care | Payer: Medicare HMO | Source: Ambulatory Visit | Attending: Acute Care | Admitting: Acute Care

## 2022-11-05 DIAGNOSIS — Z87891 Personal history of nicotine dependence: Secondary | ICD-10-CM | POA: Insufficient documentation

## 2022-11-05 DIAGNOSIS — Z122 Encounter for screening for malignant neoplasm of respiratory organs: Secondary | ICD-10-CM | POA: Diagnosis not present

## 2022-11-05 DIAGNOSIS — R69 Illness, unspecified: Secondary | ICD-10-CM | POA: Diagnosis not present

## 2022-11-05 DIAGNOSIS — F17210 Nicotine dependence, cigarettes, uncomplicated: Secondary | ICD-10-CM | POA: Diagnosis present

## 2022-12-08 ENCOUNTER — Other Ambulatory Visit: Payer: Self-pay | Admitting: Family Medicine

## 2022-12-08 DIAGNOSIS — I10 Essential (primary) hypertension: Secondary | ICD-10-CM

## 2022-12-11 ENCOUNTER — Other Ambulatory Visit: Payer: Self-pay | Admitting: *Deleted

## 2022-12-11 DIAGNOSIS — Z122 Encounter for screening for malignant neoplasm of respiratory organs: Secondary | ICD-10-CM

## 2022-12-11 DIAGNOSIS — Z87891 Personal history of nicotine dependence: Secondary | ICD-10-CM

## 2022-12-11 DIAGNOSIS — F17210 Nicotine dependence, cigarettes, uncomplicated: Secondary | ICD-10-CM

## 2022-12-17 ENCOUNTER — Telehealth: Payer: Self-pay | Admitting: Family Medicine

## 2022-12-17 NOTE — Telephone Encounter (Signed)
Contacted [name] to schedule their annual wellness visit. Appointment made for 01/03/2023.  Verlee Rossetti; Care Guide Ambulatory Clinical Support [facility] Direct Dial: [phone]

## 2023-01-03 ENCOUNTER — Ambulatory Visit (INDEPENDENT_AMBULATORY_CARE_PROVIDER_SITE_OTHER): Payer: Medicare HMO

## 2023-01-03 DIAGNOSIS — Z0000 Encounter for general adult medical examination without abnormal findings: Secondary | ICD-10-CM

## 2023-01-03 NOTE — Progress Notes (Cosign Needed Addendum)
I connected with  William Yang on 01/03/23 by a audio enabled telemedicine application and verified that I am speaking with the correct person using two identifiers.  Patient Location: Home  Provider Location: Home Office  I discussed the limitations of evaluation and management by telemedicine. The patient expressed understanding and agreed to proceed.   Subjective:   William Yang is a 67 y.o. male who presents for Medicare Annual/Subsequent preventive examination.  Review of Systems    Per HPI unless specifically indicated below.  Cardiac Risk Factors include: advanced age (>18men, >20 women);male gender, Essential Hypertension, and Hyperlipidemia.         Objective:       11/05/2022    9:00 AM 07/20/2022    9:31 AM 07/20/2022    9:04 AM  Vitals with BMI  Height 5\' 6"   5\' 6"   Weight 180 lbs  187 lbs 6 oz  BMI 29.07  30.26  Systolic  126 130  Diastolic  80 82  Pulse   68    There were no vitals filed for this visit. There is no height or weight on file to calculate BMI.     01/03/2023   10:07 AM 12/28/2021    8:21 AM 08/05/2020    2:21 PM 08/06/2019    2:17 PM 08/05/2018    1:05 PM 07/10/2018    2:52 PM  Advanced Directives  Does Patient Have a Medical Advance Directive? Yes Yes No No Yes No  Type of Estate agent of [employer] Power of [place];Living will   Healthcare Power of [place];Living will   Does patient want to make changes to medical advance directive? No - Patient declined No - Patient declined      Copy of Healthcare Power of Attorney in Chart? No - copy requested No - copy requested   No - copy requested   Would patient like information on creating a medical advance directive?    No - Patient declined  No - Patient declined    Current Medications (verified) Outpatient Encounter Medications as of 01/03/2023  Medication Sig   atorvastatin (LIPITOR) 40 MG tablet Take 1 tablet (40 mg total) by mouth daily.    hydrochlorothiazide (HYDRODIURIL) 25 MG tablet TAKE 1 TABLET BY MOUTH DAILY   lisinopril (ZESTRIL) 40 MG tablet TAKE 1 TABLET BY MOUTH DAILY   sildenafil (VIAGRA) 100 MG tablet Take 1 tablet (100 mg total) by mouth daily as needed for erectile dysfunction.   aspirin EC 81 MG tablet Take 1 tablet (81 mg total) by mouth daily. Swallow whole. (Patient not taking: Reported on 01/03/2023)   No facility-administered encounter medications on file as of 01/03/2023.    Allergies (verified) Patient has no known allergies.   History: Past Medical History:  Diagnosis Date   Chickenpox    Hyperlipidemia    Hypertension    Past Surgical History:  Procedure Laterality Date   APPENDECTOMY  1964   Family History  Problem Relation Age of Onset   Alcoholism Other        Parent   Lung cancer Brother    Hypertension Other        Parent   Social History   Socioeconomic History   Marital status: Widowed    Spouse name: Not on file   Number of children: 2   Years of education: Not on file   Highest education level: Not on file  Occupational History   Occupation: Retired  Tobacco Use  Smoking status: Former    Packs/day: 1.00    Years: 45.00    Additional pack years: 0.00    Total pack years: 45.00    Types: Cigarettes    Quit date: 12/26/2022    Years since quitting: 0.0   Smokeless tobacco: Never  Vaping Use   Vaping Use: Never used  Substance and Sexual Activity   Alcohol use: Yes    Alcohol/week: 21.0 standard drinks of alcohol    Types: 21 Standard drinks or equivalent per week    Comment: 21 beers a week    Drug use: No   Sexual activity: Not on file  Other Topics Concern   Not on file  Social History Narrative   Not on file   Social Determinants of Health   Financial Resource Strain: Low Risk  (01/03/2023)   Overall Financial Resource Strain (CARDIA)    Difficulty of Paying Living Expenses: Not hard at all  Food Insecurity: No Food Insecurity (01/03/2023)   Hunger Vital  Sign    Worried About Running Out of Food in the Last Year: Never true    Ran Out of Food in the Last Year: Never true  Transportation Needs: No Transportation Needs (01/03/2023)   PRAPARE - Administrator, Civil Service (Medical): No    Lack of Transportation (Non-Medical): No  Physical Activity: Inactive (01/03/2023)   Exercise Vital Sign    Days of Exercise per Week: 0 days    Minutes of Exercise per Session: 0 min  Stress: No Stress Concern Present (01/03/2023)   [employer] of Occupational Health - Occupational Stress Questionnaire    Feeling of Stress : Not at all  Social Connections: Moderately Integrated (01/03/2023)   Social Connection and Isolation Panel [NHANES]    Frequency of Communication with Friends and Family: More than three times a week    Frequency of Social Gatherings with Friends and Family: Twice a week    Attends Religious Services: More than 4 times per year    Active Member of [employer] or Organizations: Yes    Attends Banker Meetings: 1 to 4 times per year    Marital Status: Widowed    Tobacco Counseling Counseling given: No   Clinical Intake:  Pre-visit preparation completed: No  Pain : No/denies pain     Nutritional Status: BMI 25 -29 Overweight Nutritional Risks: None Diabetes: No  How often do you need to have someone help you when you read instructions, pamphlets, or other written materials from your doctor or pharmacy?: 1 - Never  Diabetic?No  Interpreter Needed?: No  Information entered by :: Laurel Dimmer, cMA   Activities of Daily Living    01/03/2023   10:01 AM  In your present state of health, do you have any difficulty performing the following activities:  Hearing? 1  Vision? 1  Comment Eye Exam 07/2022. [facility]  Difficulty concentrating or making decisions? 0  Walking or climbing stairs? 0  Dressing or bathing? 0  Doing errands, shopping? 0    Patient Care Team: Glori Luis, MD as PCP - General (Family Medicine) Debbe Odea, MD as PCP - Cardiology (Cardiology) Creig Hines, MD as Consulting Physician (Oncology)  Indicate any recent Medical Services you may have received from other than [facility] providers in the past year (date may be approximate).     Assessment:   This is a routine wellness examination for William Yang.   Hearing/Vision screen Admit to some hearing  loss that he contributes to being in the [employer]. Denies any change to her vision. Annual [facility] Exam. [facility]   Dietary issues and exercise activities discussed: Current Exercise Habits: The patient does not participate in regular exercise at present, Exercise limited by: None identified   Goals Addressed   None    Depression Screen    01/03/2023   10:01 AM 07/20/2022    9:06 AM 12/28/2021    8:25 AM 06/27/2021   11:41 AM 01/19/2021   11:25 AM 10/24/2020    9:31 AM 06/20/2020   10:28 AM  PHQ 2/9 Scores  PHQ - 2 Score 0 0 0 0 0 0 0    Fall Risk    01/03/2023   10:01 AM 07/20/2022    9:05 AM 12/28/2021    8:24 AM 06/27/2021   11:41 AM 01/19/2021   11:25 AM  Fall Risk   Falls in the past year? 0 0 0 0 0  Number falls in past yr: 0 0 0 0 0  Injury with Fall? 0 0  0 0  Risk for fall due to : No Fall Risks No Fall Risks  No Fall Risks   Follow up Falls evaluation completed Falls evaluation completed Falls evaluation completed Falls evaluation completed Falls evaluation completed    FALL RISK PREVENTION PERTAINING TO THE HOME:  Any stairs in or around the home? No  If so, are there any without handrails? No  Home free of loose throw rugs in walkways, pet beds, electrical cords, etc? Yes  Adequate lighting in your home to reduce risk of falls? Yes   ASSISTIVE DEVICES UTILIZED TO PREVENT FALLS:  Life alert? No  Use of a cane, walker or w/c? No  Grab bars in the bathroom? No  Shower chair or bench in shower? Yes  Elevated toilet seat or a handicapped toilet? No   TIMED UP  AND GO:  Was the test performed? Unable to perform, virtual appointment   Cognitive Function:        01/03/2023   10:04 AM 12/28/2021    8:26 AM  6CIT Screen  What Year? 0 points 0 points  What month? 0 points 0 points  What time? 0 points 0 points  Count back from 20 0 points 0 points  Months in reverse 0 points 0 points  Repeat phrase 0 points 0 points  Total Score 0 points 0 points    Immunizations Immunization History  Administered Date(s) Administered   Influenza,inj,Quad PF,6+ Mos 06/25/2018   Influenza-Unspecified 05/21/2019, 05/20/2020, 06/07/2020   Janssen (J&J) SARS-COV-2 Vaccination 11/26/2019   Pneumococcal Polysaccharide-23 09/19/2017   Tdap 11/17/2015, 09/19/2017   Zoster Recombinat (Shingrix) 05/21/2019, 08/21/2019   Zoster, Live 11/17/2015    TDAP status: Up to date  Flu Vaccine status: Up to date  Pneumococcal vaccine status: Due, Education has been provided regarding the importance of this vaccine. Advised may receive this vaccine at local pharmacy or Health Dept. Aware to provide a copy of the vaccination record if obtained from local pharmacy or Health Dept. Verbalized acceptance and understanding.  Covid-19 vaccine status: Information provided on how to obtain vaccines.   Qualifies for Shingles Vaccine? Yes   Zostavax completed Yes   Shingrix Completed?: Yes  Screening Tests Health Maintenance  Topic Date Due   Pneumonia Vaccine 61+ Years old (2 of 2 - PCV) 09/19/2018   COVID-19 Vaccine (2 - Janssen risk series) 12/24/2019   INFLUENZA VACCINE  03/21/2023   Lung Cancer Screening  11/05/2023   Medicare Annual Wellness (AWV)  01/03/2024   Fecal DNA (Cologuard)  11/08/2024   DTaP/Tdap/Td (3 - Td or Tdap) 09/20/2027   Hepatitis C Screening  Completed   Zoster Vaccines- Shingrix  Completed   HPV VACCINES  Aged Out    Health Maintenance  Health Maintenance Due  Topic Date Due   Pneumonia Vaccine 17+ Years old (2 of 2 - PCV) 09/19/2018    COVID-19 Vaccine (2 - Janssen risk series) 12/24/2019    Colorectal cancer screening: Type of screening: Cologuard. Completed 11/08/2021. Repeat every 3 years  Lung Cancer Screening: (Low Dose CT Chest recommended if Age 61-80 years, 30 pack-year currently smoking OR have quit w/in 15years.) does qualify.   Lung Cancer Screening Referral: ordered 12/11/2022  Additional Screening:  Hepatitis C Screening: does qualify; Completed 11/18/2015  Vision Screening: Recommended annual ophthalmology exams for early detection of glaucoma and other disorders of the eye. Is the patient up to date with their annual eye exam?  Yes  Who is the provider or what is the name of the office in which the patient attends annual eye exams? [facility]  If pt is not established with a provider, would they like to be referred to a provider to establish care? No .   Dental Screening: Recommended annual dental exams for proper oral hygiene  Community Resource Referral / Chronic Care Management: CRR required this visit?  No   CCM required this visit?  No      Plan:     I have personally reviewed and noted the following in the patient's chart:   Medical and social history Use of alcohol, tobacco or illicit drugs  Current medications and supplements including opioid prescriptions. Patient is not currently taking opioid prescriptions. Functional ability and status Nutritional status Physical activity Advanced directives List of other physicians Hospitalizations, surgeries, and ER visits in previous 12 months Vitals Screenings to include cognitive, depression, and falls Referrals and appointments  In addition, I have reviewed and discussed with patient certain preventive protocols, quality metrics, and best practice recommendations. A written personalized care plan for preventive services as well as general preventive health recommendations were provided to patient.    Mr. Trettel , Thank you for  taking time to come for your Medicare Wellness Visit. I appreciate your ongoing commitment to your health goals. Please review the following plan we discussed and let me know if I can assist you in the future.   These are the goals we discussed:  Goals      Increase physical activity     Walk for exercise        This is a list of the screening recommended for you and due dates:  Health Maintenance  Topic Date Due   Pneumonia Vaccine (2 of 2 - PCV) 09/19/2018   COVID-19 Vaccine (2 - Janssen risk series) 12/24/2019   Flu Shot  03/21/2023   Screening for Lung Cancer  11/05/2023   Medicare Annual Wellness Visit  01/03/2024   Cologuard (Stool DNA test)  11/08/2024   DTaP/Tdap/Td vaccine (3 - Td or Tdap) 09/20/2027   Hepatitis C Screening: USPSTF Recommendation to screen - Ages 45-79 yo.  Completed   Zoster (Shingles) Vaccine  Completed   HPV Vaccine  Aged [address] [place]   01/03/2023   Nurse Notes: Approximately 30 minute Non-Face -To-Face Medicare Wellness Visit

## 2023-01-03 NOTE — Patient Instructions (Signed)
Health Maintenance, Male Adopting a healthy lifestyle and getting preventive care are important in promoting health and wellness. Ask your health care provider about: The right schedule for you to have regular tests and exams. Things you can do on your own to prevent diseases and keep yourself healthy. What should I know about diet, weight, and exercise? Eat a healthy diet  Eat a diet that includes plenty of vegetables, fruits, low-fat dairy products, and lean protein. Do not eat a lot of foods that are high in solid fats, added sugars, or sodium. Maintain a healthy weight Body mass index (BMI) is a measurement that can be used to identify possible weight problems. It estimates body fat based on height and weight. Your health care provider can help determine your BMI and help you achieve or maintain a healthy weight. Get regular exercise Get regular exercise. This is one of the most important things you can do for your health. Most adults should: Exercise for at least 150 minutes each week. The exercise should increase your heart rate and make you sweat (moderate-intensity exercise). Do strengthening exercises at least twice a week. This is in addition to the moderate-intensity exercise. Spend less time sitting. Even light physical activity can be beneficial. Watch cholesterol and blood lipids Have your blood tested for lipids and cholesterol at 68 years of age, then have this test every 5 years. You may need to have your cholesterol levels checked more often if: Your lipid or cholesterol levels are high. You are older than 68 years of age. You are at high risk for heart disease. What should I know about cancer screening? Many types of cancers can be detected early and may often be prevented. Depending on your health history and family history, you may need to have cancer screening at various ages. This may include screening for: Colorectal cancer. Prostate cancer. Skin cancer. Lung  cancer. What should I know about heart disease, diabetes, and high blood pressure? Blood pressure and heart disease High blood pressure causes heart disease and increases the risk of stroke. This is more likely to develop in people who have high blood pressure readings or are overweight. Talk with your health care provider about your target blood pressure readings. Have your blood pressure checked: Every 3-5 years if you are 18-39 years of age. Every year if you are 40 years old or older. If you are between the ages of 65 and 75 and are a current or former smoker, ask your health care provider if you should have a one-time screening for abdominal aortic aneurysm (AAA). Diabetes Have regular diabetes screenings. This checks your fasting blood sugar level. Have the screening done: Once every three years after age 45 if you are at a normal weight and have a low risk for diabetes. More often and at a younger age if you are overweight or have a high risk for diabetes. What should I know about preventing infection? Hepatitis B If you have a higher risk for hepatitis B, you should be screened for this virus. Talk with your health care provider to find out if you are at risk for hepatitis B infection. Hepatitis C Blood testing is recommended for: Everyone born from 1945 through 1965. Anyone with known risk factors for hepatitis C. Sexually transmitted infections (STIs) You should be screened each year for STIs, including gonorrhea and chlamydia, if: You are sexually active and are younger than 68 years of age. You are older than 68 years of age and your   health care provider tells you that you are at risk for this type of infection. Your sexual activity has changed since you were last screened, and you are at increased risk for chlamydia or gonorrhea. Ask your health care provider if you are at risk. Ask your health care provider about whether you are at high risk for HIV. Your health care provider  may recommend a prescription medicine to help prevent HIV infection. If you choose to take medicine to prevent HIV, you should first get tested for HIV. You should then be tested every 3 months for as long as you are taking the medicine. Follow these instructions at home: Alcohol use Do not drink alcohol if your health care provider tells you not to drink. If you drink alcohol: Limit how much you have to 0-2 drinks a day. Know how much alcohol is in your drink. In the [place] one drink equals one 12 oz bottle of beer (355 mL), one 5 oz glass of wine (148 mL), or one 1 oz glass of hard liquor (44 mL). Lifestyle Do not use any products that contain nicotine or tobacco. These products include cigarettes, chewing tobacco, and vaping devices, such as e-cigarettes. If you need help quitting, ask your health care provider. Do not use street drugs. Do not share needles. Ask your health care provider for help if you need support or information about quitting drugs. General instructions Schedule regular health, dental, and eye exams. Stay current with your vaccines. Tell your health care provider if: You often feel depressed. You have ever been abused or do not feel safe at home. Summary Adopting a healthy lifestyle and getting preventive care are important in promoting health and wellness. Follow your health care provider's instructions about healthy diet, exercising, and getting tested or screened for diseases. Follow your health care provider's instructions on monitoring your cholesterol and blood pressure. This information is not intended to replace advice given to you by your health care provider. Make sure you discuss any questions you have with your health care provider. Document Revised: 12/26/2020 Document Reviewed: 12/26/2020 Elsevier Patient Education  2023 Elsevier Inc.  

## 2023-01-08 NOTE — Addendum Note (Signed)
Addended by: Lonna Cobb on: 01/08/2023 03:49 PM   Modules accepted: Level of Service

## 2023-01-19 IMAGING — CT CT CHEST LCS NODULE FOLLOW-UP W/O CM
2 of 5 series · 15 of 40 positions shown, 18 images · non-contrast
Comparison: 09/13/2020

CLINICAL DATA: Short-term follow-up of abnormal lung cancer
screening exam, demonstrating a right middle lobe probable
hamartoma.

EXAM:
CT CHEST WITHOUT CONTRAST FOR LUNG CANCER SCREENING NODULE FOLLOW-UP
TECHNIQUE: Multidetector CT imaging of the chest was performed following the
standard protocol without IV contrast.

[Series 3: lung lcs f/u 1.00 · axial · 0.67mm/px · z∈[-1234,-926]mm · 12 of 340 slices shown, 15 images]
[im 16/340  mediastinal]
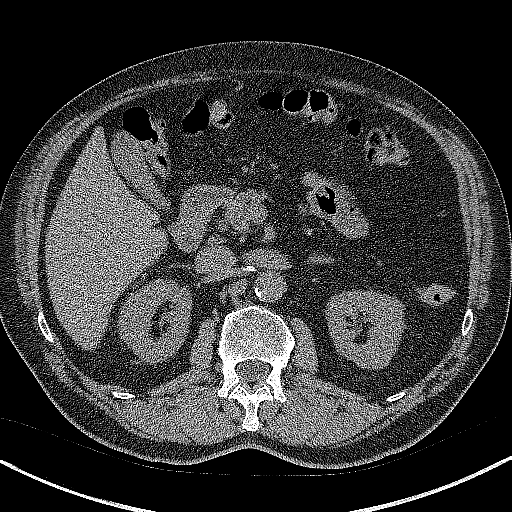
[im 16/340  lung]
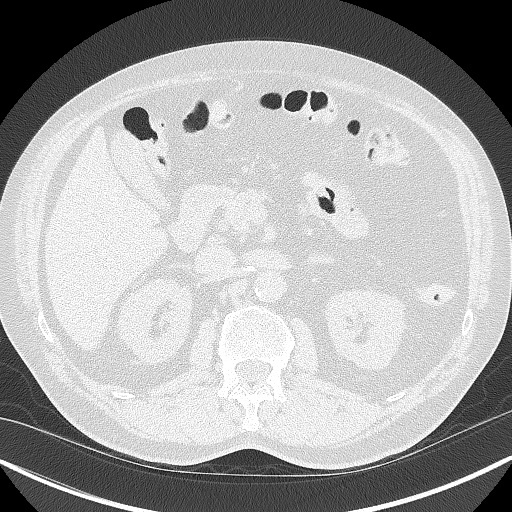
[im 47/340  lung]
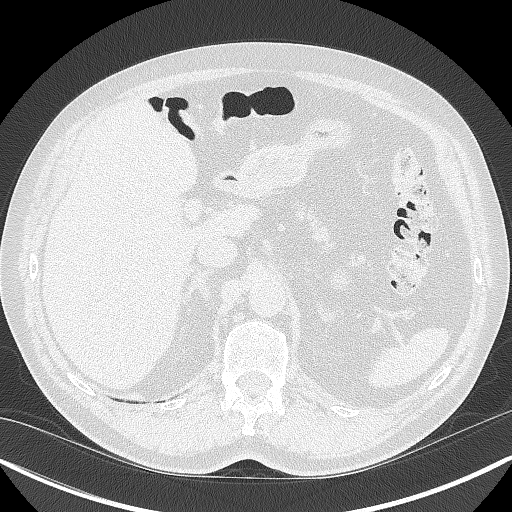
[im 78/340  lung]
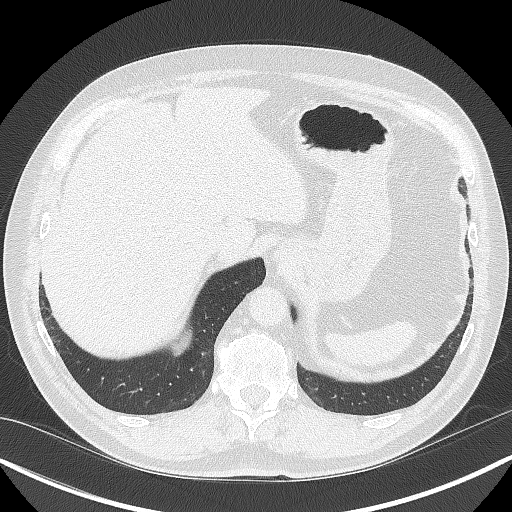
[im 108/340  lung]
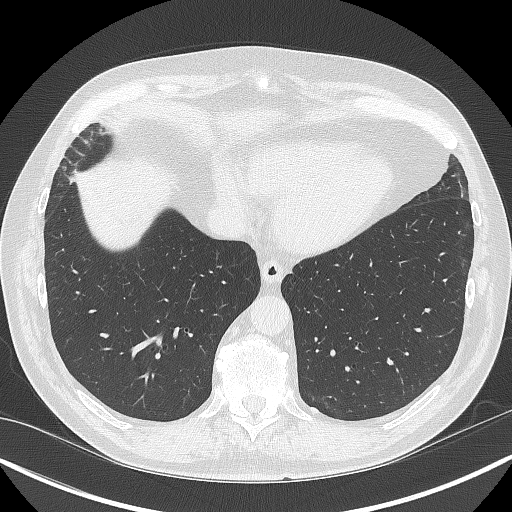
[im 124/340  mediastinal]
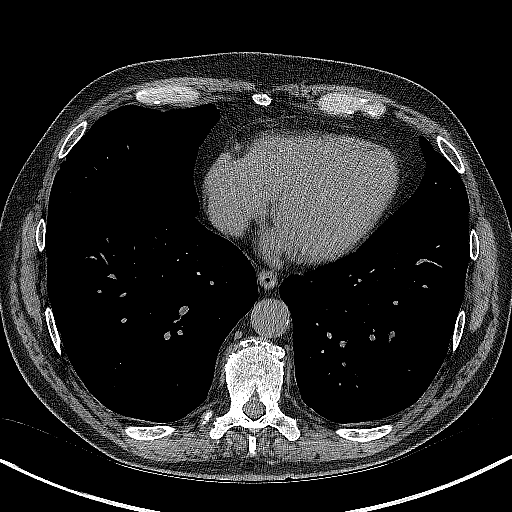
[im 124/340  lung]
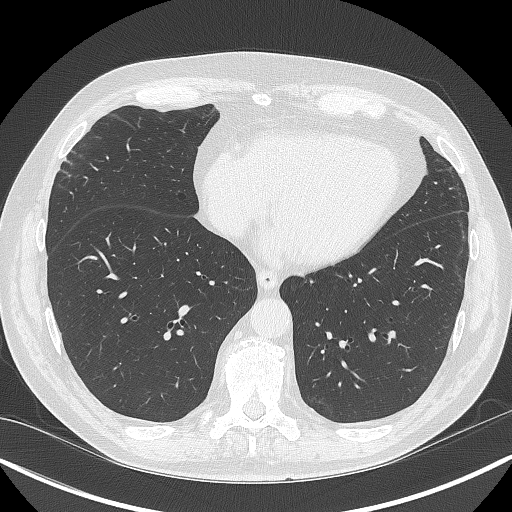
[im 155/340  lung]
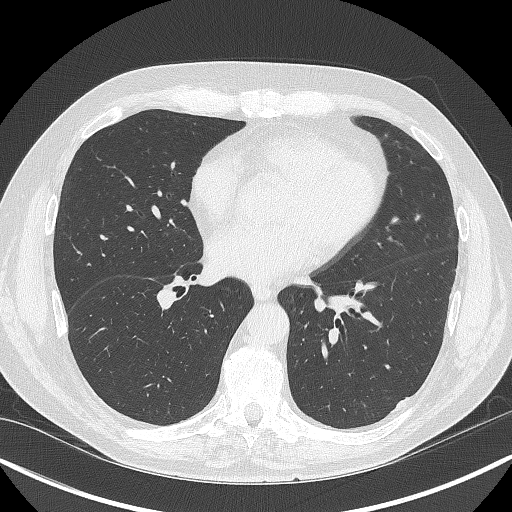
[im 185/340  lung]
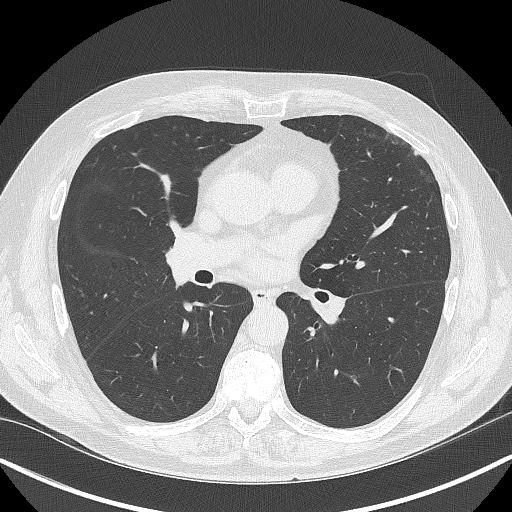
[im 216/340  lung]
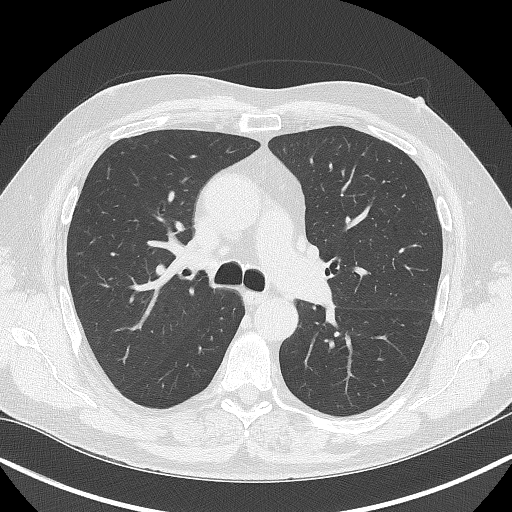
[im 232/340  mediastinal]
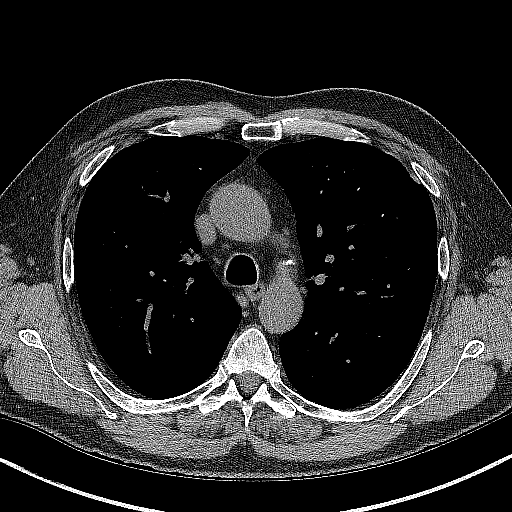
[im 232/340  lung]
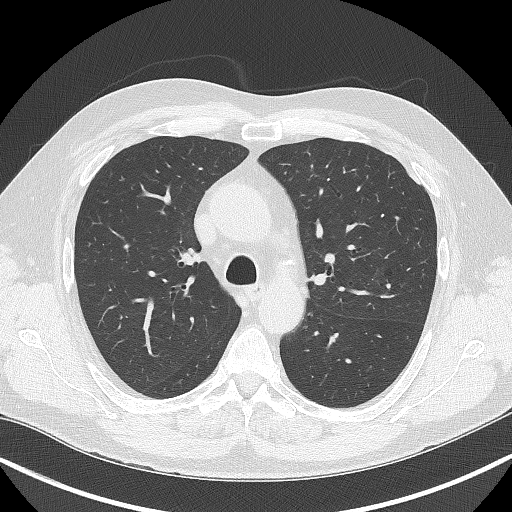
[im 262/340  lung]
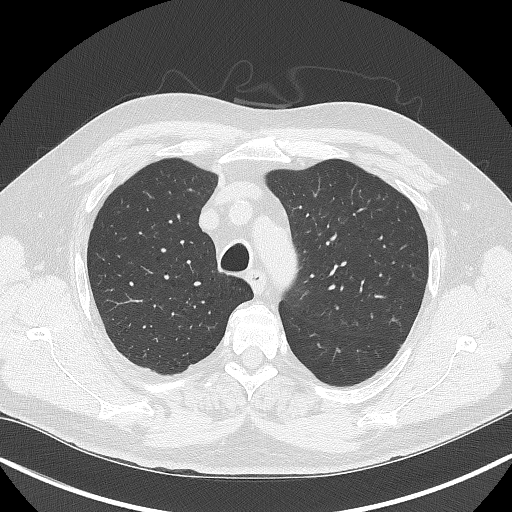
[im 293/340  lung]
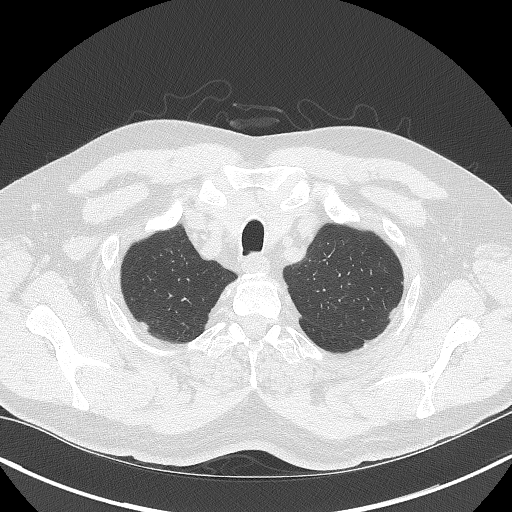
[im 324/340  lung]
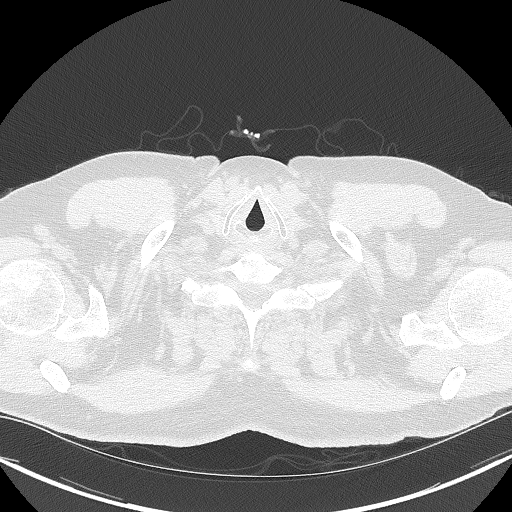

[Series 5: lcs f/u 1.00 cor · coronal · 0.66mm/px · 3 of 301 slices shown]
[im 61/301  lung]
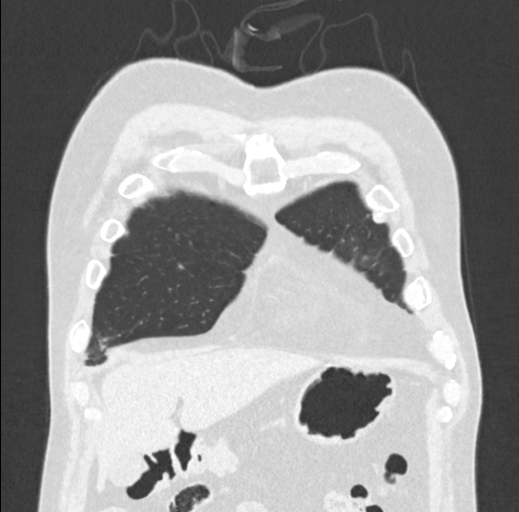
[im 121/301  lung]
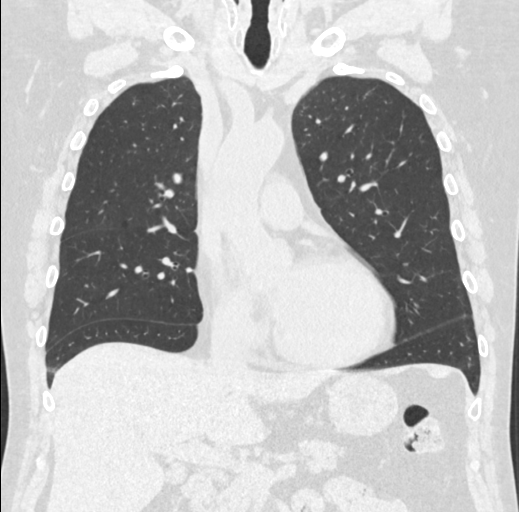
[im 181/301  lung]
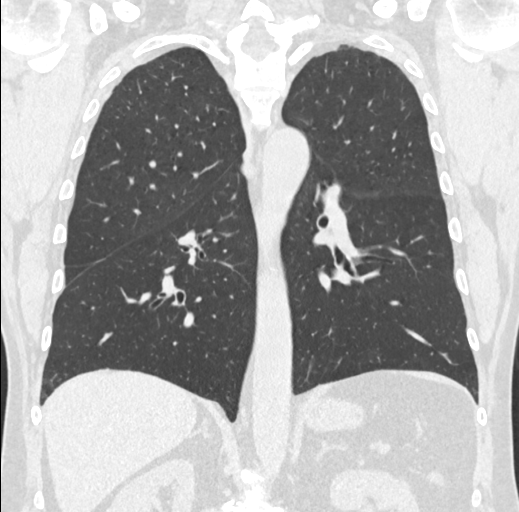

[15 of 40 positions shown; findings below may reference images not displayed]

FINDINGS: Cardiovascular: Aortic atherosclerosis. Normal heart size, without
pericardial effusion. Lad coronary artery calcification.

Mediastinum/Nodes: No mediastinal or definite hilar adenopathy,
given limitations of unenhanced CT.

Lungs/Pleura: No pleural fluid. Bilateral areas of pleural
thickening are similar. Left-sided foci of pleural calcification.

Mild centrilobular emphysema. The right middle lobe
well-circumscribed pulmonary nodule is similar, including at volume
derived equivalent diameter 20.8 mm. Again demonstrates
calcification and suggestion of fat density within. No new pulmonary
nodules identified.

Upper Abdomen: Normal imaged portions of the liver, spleen, stomach,
pancreas, gallbladder, adrenal glands, kidneys.

Musculoskeletal: Remote right rib fractures. Lower thoracic
spondylosis.
IMPRESSION: 1. Lung-RADS 2, benign appearance or behavior. Continue annual
screening with low-dose chest CT without contrast in 12 months. The
right middle lobe nodule of interest is similar and most likely
represents a hamartoma.
2. Aortic Atherosclerosis (SJWR3-GSK.K) and Emphysema (SJWR3-YJ5.3).
Coronary artery atherosclerosis.
3. Bilateral pleural plaques with left-sided pleural calcification.
Suspicious for asbestos related pleural disease.

## 2023-02-18 ENCOUNTER — Other Ambulatory Visit: Payer: Self-pay | Admitting: Family Medicine

## 2023-02-20 ENCOUNTER — Telehealth: Payer: Self-pay

## 2023-02-20 NOTE — Telephone Encounter (Signed)
Refill sent to pharmacy for #[phone]

## 2023-02-20 NOTE — Telephone Encounter (Signed)
Patient has been scheduled for his physical 04/05/23 @ 1:00.

## 2023-02-20 NOTE — Telephone Encounter (Signed)
Called pt to get him scheduled per pcps note in December pt was supposed to follow up in 6 months:    "Return in about 6 months (around 01/19/2023) for CPE. "   We received a refill request for hydrochlorothiazide wanted pt to be scheduled  Pt did not answer and VM box was full unable to leave VM

## 2023-02-20 NOTE — Telephone Encounter (Signed)
Called pt to get him scheduled as he was supposed to follow up in 6 months for a CPE per PCP note in December:   Return in about 6 months (around 01/19/2023) for CPE.   Pts VM box full unable to leave a VM

## 2023-03-10 ENCOUNTER — Other Ambulatory Visit: Payer: Self-pay | Admitting: Family Medicine

## 2023-03-10 DIAGNOSIS — I10 Essential (primary) hypertension: Secondary | ICD-10-CM

## 2023-03-20 ENCOUNTER — Encounter (INDEPENDENT_AMBULATORY_CARE_PROVIDER_SITE_OTHER): Payer: Self-pay

## 2023-04-05 ENCOUNTER — Encounter: Payer: Self-pay | Admitting: Family Medicine

## 2023-04-05 ENCOUNTER — Ambulatory Visit: Payer: Medicare HMO | Admitting: Family Medicine

## 2023-04-05 VITALS — BP 120/78 | HR 74 | Temp 98.0°F | Ht 66.0 in | Wt 185.0 lb

## 2023-04-05 DIAGNOSIS — G479 Sleep disorder, unspecified: Secondary | ICD-10-CM | POA: Diagnosis not present

## 2023-04-05 DIAGNOSIS — Z0001 Encounter for general adult medical examination with abnormal findings: Secondary | ICD-10-CM

## 2023-04-05 DIAGNOSIS — Z125 Encounter for screening for malignant neoplasm of prostate: Secondary | ICD-10-CM

## 2023-04-05 DIAGNOSIS — Z0000 Encounter for general adult medical examination without abnormal findings: Secondary | ICD-10-CM

## 2023-04-05 DIAGNOSIS — F17210 Nicotine dependence, cigarettes, uncomplicated: Secondary | ICD-10-CM | POA: Diagnosis not present

## 2023-04-05 DIAGNOSIS — E785 Hyperlipidemia, unspecified: Secondary | ICD-10-CM | POA: Diagnosis not present

## 2023-04-05 DIAGNOSIS — R7303 Prediabetes: Secondary | ICD-10-CM

## 2023-04-05 MED ORDER — HYDROCHLOROTHIAZIDE 25 MG PO TABS: 25.0000 mg | ORAL_TABLET | Freq: Every day | ORAL | 0 refills | Status: DC

## 2023-04-05 NOTE — Progress Notes (Addendum)
Marikay Alar, MD Phone: [phone]  William Yang is a 67 y.o. male who presents today for CPE.  Diet: Eats what he wants, generally gets vegetables daily, has 2 bottles of soda daily, occasional sweet tea, not many sweets Exercise: No specific exercise though does yard work Colonoscopy: Cologuard is up-to-date Prostate cancer screening: Due Family history-  Prostate cancer: Brother  Colon cancer: no Vaccines-   Flu: declines  Tetanus: UTD  Shingles: UTD  COVID19: x2  Pneumonia: defers Hep C Screening: UTD Tobacco use: Patient notes he smoking less, sometimes he smokes up to a pack a day though he goes multiple days without smoking at times Alcohol use: 21 alcoholic beverages a week, up to a sixpack at a time when he does drink Illicit Drug use: No Dentist: Yes Ophthalmology: Yes  Sleep issues: Patient reports at times having trouble falling asleep and then at times waking up after 2 to 3 hours.  He has tried over-the-counter sleep aids with no benefit.  Sometimes he naps during the day.  No anxiety or depression.  He has caffeine up until 8 PM.  He does sometimes watch TV the hour before bed.   Active Ambulatory Problems    Diagnosis Date Noted   Essential hypertension 09/29/2015   Hyperlipidemia 09/29/2015   Posterior left knee pain 05/07/2016   Plantar fasciitis, bilateral 02/19/2017   Nevus 03/29/2017   Alcohol abuse 03/29/2017   Family history of prostate cancer 06/03/2017   Elevated PSA 12/18/2017   Leukocytosis 12/18/2017   Erectile dysfunction 06/25/2018   Neck pain 06/25/2018   Prediabetes 05/04/2019   Seborrheic dermatitis 06/20/2020   Skin tag 06/20/2020   Lung nodule 10/24/2020   Nicotine dependence, cigarettes, uncomplicated 10/24/2020   Tick bite of abdominal wall 01/19/2021   Dyspnea on exertion 02/28/2021   Encounter for sexually transmitted disease counseling 06/27/2021   Encounter for general adult medical examination with abnormal findings  04/05/2023   Sleeping difficulty 04/05/2023   Resolved Ambulatory Problems    Diagnosis Date Noted   Urine troubles 09/29/2015   Abscess 09/04/2016   Anxiety 09/04/2016   Splinter 02/19/2017   Cough 04/12/2017   Tick bite of buttock 02/12/2018   Past Medical History:  Diagnosis Date   Chickenpox    Hypertension     Family History  Problem Relation Age of Onset   Alcoholism Other        Parent   Lung cancer Brother    Hypertension Other        Parent    Social History   Socioeconomic History   Marital status: Widowed    Spouse name: Not on file   Number of children: 2   Years of education: Not on file   Highest education level: Not on file  Occupational History   Occupation: Retired  Tobacco Use   Smoking status: Former    Current packs/day: 0.00    Average packs/day: 1 pack/day for 45.0 years (45.0 ttl pk-yrs)    Types: Cigarettes    Start date: 12/25/1977    Quit date: 12/26/2022    Years since quitting: 0.2   Smokeless tobacco: Never  Vaping Use   Vaping status: Never Used  Substance and Sexual Activity   Alcohol use: Yes    Alcohol/week: 21.0 standard drinks of alcohol    Types: 21 Standard drinks or equivalent per week    Comment: 21 beers a week    Drug use: No   Sexual activity: Not on file  Other Topics Concern   Not on file  Social History Narrative   Not on file   Social Determinants of Health   Financial Resource Strain: Low Risk  (01/03/2023)   Overall Financial Resource Strain (CARDIA)    Difficulty of Paying Living Expenses: Not hard at all  Food Insecurity: No Food Insecurity (01/03/2023)   Hunger Vital Sign    Worried About Running Out of Food in the Last Year: Never true    Ran Out of Food in the Last Year: Never true  Transportation Needs: No Transportation Needs (01/03/2023)   PRAPARE - Administrator, Civil Service (Medical): No    Lack of Transportation (Non-Medical): No  Physical Activity: Inactive (01/03/2023)    Exercise Vital Sign    Days of Exercise per Week: 0 days    Minutes of Exercise per Session: 0 min  Stress: No Stress Concern Present (01/03/2023)   [employer] of Occupational Health - Occupational Stress Questionnaire    Feeling of Stress : Not at all  Social Connections: Moderately Integrated (01/03/2023)   Social Connection and Isolation Panel [NHANES]    Frequency of Communication with Friends and Family: More than three times a week    Frequency of Social Gatherings with Friends and Family: Twice a week    Attends Religious Services: More than 4 times per year    Active Member of [employer] or Organizations: Yes    Attends Banker Meetings: 1 to 4 times per year    Marital Status: Widowed  Intimate Partner Violence: Not At Risk (01/03/2023)   Humiliation, Afraid, Rape, and Kick questionnaire    Fear of Current or Ex-Partner: No    Emotionally Abused: No    Physically Abused: No    Sexually Abused: No    ROS  General:  Negative for nexplained weight loss, fever Skin: Negative for new or changing mole, sore that won't heal HEENT: Positive for trouble hearing, tinnitus, negative for trouble seeing, mouth sores, hoarseness, change in voice, dysphagia. CV:  Negative for chest pain, dyspnea, edema, palpitations Resp: Negative for cough, dyspnea, hemoptysis GI: Negative for nausea, vomiting, diarrhea, constipation, abdominal pain, melena, hematochezia. GU: Positive for incontinence, sexual difficulty, negative for dysuria, urinary hesitance, hematuria, vaginal or penile discharge, polyuria, lumps in testicle or breasts MSK: Negative for muscle cramps or aches, joint pain or swelling Neuro: Negative for headaches, weakness, numbness, dizziness, passing out/fainting Psych: Negative for depression, anxiety, memory problems  Objective  Physical Exam Vitals:   04/05/23 1256  BP: 120/78  Pulse: 74  Temp: 98 F (36.7 C)  SpO2: 97%    BP Readings from Last 3  Encounters:  04/05/23 120/78  07/20/22 126/80  06/27/21 130/78   Wt Readings from Last 3 Encounters:  04/05/23 185 lb (83.9 kg)  11/05/22 180 lb (81.6 kg)  07/20/22 187 lb 6.4 oz (85 kg)    Physical Exam Constitutional:      General: He is not in acute distress.    Appearance: He is not diaphoretic.  HENT:     Head: Normocephalic and atraumatic.  Cardiovascular:     Rate and Rhythm: Normal rate and regular rhythm.     Heart sounds: Normal heart sounds.  Pulmonary:     Effort: Pulmonary effort is normal.     Breath sounds: Normal breath sounds.  Abdominal:     General: Bowel sounds are normal. There is no distension.     Palpations: Abdomen is soft.  Tenderness: There is no abdominal tenderness.  Musculoskeletal:     Right lower leg: No edema.     Left lower leg: No edema.  Lymphadenopathy:     Cervical: No cervical adenopathy.  Skin:    General: Skin is warm and dry.  Neurological:     Mental Status: He is alert.  Psychiatric:        Mood and Affect: Mood normal.      Assessment/Plan:   Encounter for general adult medical examination with abnormal findings Assessment & Plan: Physical exam completed.  Encouraged healthy diet and exercise.  Discussed adding in exercise with a goal of 150 minutes/week of moderate exercise.  Patient will work on reducing sugary drink intake.  PSA will be done with lab work.  Colon cancer screening and lung cancer screening are up-to-date.  Patient declines flu vaccine.  He declines further COVID vaccinations.  He will consider pneumonia vaccine.  Encourage smoking cessation.  Patient is not ready to quit smoking.  Discussed reducing alcohol intake to less than 14 beverages per week and no more than 3 beverages at a time when he does drink.  Lab work as outlined.   Hyperlipidemia, unspecified hyperlipidemia type -     Comprehensive metabolic panel; Future -     Lipid panel; Future  Prediabetes -     Hemoglobin A1c;  Future  Nicotine dependence, cigarettes, uncomplicated Assessment & Plan: Encourage smoking cessation.  Patient is not yet ready to quit.   Prostate cancer screening -     PSA; Future  Sleeping difficulty Assessment & Plan: Chronic issue.  Discussed reduction in alcohol intake and taking in no caffeinated beverages after 1 PM would be helpful.  Discussed eliminating screen time the hour before bed.   Other orders -     hydroCHLOROthiazide; Take 1 tablet (25 mg total) by mouth daily.  Dispense: 90 tablet; Refill: 0    Return in about 1 week (around 04/12/2023) for labs, 6 months PCP.   Marikay Alar, MD [facility]

## 2023-04-05 NOTE — Assessment & Plan Note (Signed)
Encourage smoking cessation.  Patient is not yet ready to quit.

## 2023-04-05 NOTE — Assessment & Plan Note (Signed)
Chronic issue.  Discussed reduction in alcohol intake and taking in no caffeinated beverages after 1 PM would be helpful.  Discussed eliminating screen time the hour before bed.

## 2023-04-05 NOTE — Assessment & Plan Note (Signed)
Physical exam completed.  Encouraged healthy diet and exercise.  Discussed adding in exercise with a goal of 150 minutes/week of moderate exercise.  Patient will work on reducing sugary drink intake.  PSA will be done with lab work.  Colon cancer screening and lung cancer screening are up-to-date.  Patient declines flu vaccine.  He declines further COVID vaccinations.  He will consider pneumonia vaccine.  Encourage smoking cessation.  Patient is not ready to quit smoking.  Discussed reducing alcohol intake to less than 14 beverages per week and no more than 3 beverages at a time when he does drink.  Lab work as outlined.

## 2023-04-11 ENCOUNTER — Other Ambulatory Visit (INDEPENDENT_AMBULATORY_CARE_PROVIDER_SITE_OTHER): Payer: Medicare HMO

## 2023-04-11 DIAGNOSIS — E785 Hyperlipidemia, unspecified: Secondary | ICD-10-CM | POA: Diagnosis not present

## 2023-04-11 DIAGNOSIS — Z125 Encounter for screening for malignant neoplasm of prostate: Secondary | ICD-10-CM

## 2023-04-11 DIAGNOSIS — R7303 Prediabetes: Secondary | ICD-10-CM | POA: Diagnosis not present

## 2023-04-11 LAB — HEMOGLOBIN A1C: Hgb A1c MFr Bld: 6.3 % (ref 4.6–6.5)

## 2023-04-11 LAB — LIPID PANEL
Cholesterol: 158 mg/dL (ref 0–200)
HDL: 45.2 mg/dL (ref >39.00)
LDL Cholesterol: 82 mg/dL (ref 0–99)
NonHDL: 113.11
Total CHOL/HDL Ratio: 4
Triglycerides: 154 mg/dL — ABNORMAL HIGH (ref 0.0–149.0)
VLDL: 30.8 mg/dL (ref 0.0–40.0)

## 2023-04-11 LAB — COMPREHENSIVE METABOLIC PANEL
ALT: 21 U/L (ref 0–53)
AST: 16 U/L (ref 0–37)
Albumin: 4.1 g/dL (ref 3.5–5.2)
Alkaline Phosphatase: 81 U/L (ref 39–117)
BUN: 12 mg/dL (ref 6–23)
CO2: 27 mEq/L (ref 19–32)
Calcium: 9.3 mg/dL (ref 8.4–10.5)
Chloride: 102 meq/L (ref 96–112)
Creatinine, Ser: 0.96 mg/dL (ref 0.40–1.50)
GFR: 81.52 mL/min (ref >60.00)
Glucose, Bld: 107 mg/dL — ABNORMAL HIGH (ref 70–99)
Potassium: 4.1 meq/L (ref 3.5–5.1)
Sodium: 137 meq/L (ref 135–145)
Total Bilirubin: 0.7 mg/dL (ref 0.2–1.2)
Total Protein: 6.6 g/dL (ref 6.0–8.3)

## 2023-04-12 LAB — PSA: PSA: 5.22 ng/mL — ABNORMAL HIGH (ref 0.10–4.00)

## 2023-04-17 ENCOUNTER — Other Ambulatory Visit: Payer: Self-pay | Admitting: Family Medicine

## 2023-04-17 DIAGNOSIS — R972 Elevated prostate specific antigen [PSA]: Secondary | ICD-10-CM

## 2023-05-14 ENCOUNTER — Ambulatory Visit: Payer: Medicare HMO | Admitting: Urology

## 2023-05-14 ENCOUNTER — Other Ambulatory Visit
Admission: RE | Admit: 2023-05-14 | Discharge: 2023-05-14 | Disposition: A | Discharge status: Home / Self Care | Payer: Medicare HMO | Attending: Urology | Admitting: Urology

## 2023-05-14 VITALS — BP 177/97 | HR 70 | Ht 66.0 in | Wt 190.0 lb

## 2023-05-14 DIAGNOSIS — R399 Unspecified symptoms and signs involving the genitourinary system: Secondary | ICD-10-CM

## 2023-05-14 DIAGNOSIS — N529 Male erectile dysfunction, unspecified: Secondary | ICD-10-CM | POA: Diagnosis not present

## 2023-05-14 DIAGNOSIS — R972 Elevated prostate specific antigen [PSA]: Secondary | ICD-10-CM | POA: Diagnosis not present

## 2023-05-14 MED ORDER — TADALAFIL 20 MG PO TABS: 20.0000 mg | ORAL_TABLET | Freq: Every day | ORAL | 6 refills | Status: DC | PRN

## 2023-05-14 MED ORDER — TAMSULOSIN HCL 0.4 MG PO CAPS: 0.4000 mg | ORAL_CAPSULE | Freq: Every day | ORAL | 11 refills | Status: DC

## 2023-05-14 NOTE — Progress Notes (Signed)
05/14/23 11:34 AM   [name] Jul 15, 1956 [id number]  CC: Elevated PSA, postvoid dribbling, ED  HPI: 67 year old male referred for an elevated PSA of 5.22 as well as postvoid dribbling that is becoming increasingly bothersome as well as ED.  Most recent PSA was 5.22 from August 2024 which had increased from 3.35 in December 2021, 4.4 25 June 2020, 3.22 Dec 2017, 4.23 August 2017, 2.24 May 2017.  PSA has been variable.    He has had postvoid dribbling for at least a year which is bothersome after he goes to the bathroom.  He denies significant weak stream or urinary symptoms aside from the postvoid dribbling.  He reports problems with ED, minimal to moderate improvement on 100 mg sildenafil on demand.  He is interested in other options.   PMH: Past Medical History:  Diagnosis Date   Chickenpox    Hyperlipidemia    Hypertension     Surgical History: Past Surgical History:  Procedure Laterality Date   APPENDECTOMY  1964    Family History: Family History  Problem Relation Age of Onset   Alcoholism Other        Parent   Lung cancer Brother    Hypertension Other        Parent    Social History:  reports that he quit smoking about 4 months ago. His smoking use included cigarettes. He started smoking about 45 years ago. He has a 45 pack-year smoking history. He has never used smokeless tobacco. He reports current alcohol use of about 21.0 standard drinks of alcohol per week. He reports that he does not use drugs.  Physical Exam: BP (!) 177/97 (BP Location: Left Arm, Patient Position: Sitting, Cuff Size: Normal) Comment: No BP meds in days  Pulse 70   Ht 5\' 6"  (1.676 m)   Wt 190 lb (86.2 kg)   BMI 30.67 kg/m    Constitutional:  Alert and oriented, No acute distress. Cardiovascular: No clubbing, cyanosis, or edema. Respiratory: Normal respiratory effort, no increased work of breathing.  Assessment & Plan:   67 year old male with history of variable PSA, most  recent value 5.2.  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  He has bothersome postvoid dribbling and was interested in trial of Flomax.  We reviewed the implications of an elevated PSA and the uncertainty surrounding it. In general, a man's PSA increases with age and is produced by both normal and cancerous prostate tissue. The differential diagnosis for elevated PSA includes BPH, prostate cancer, infection, recent intercourse/ejaculation, recent urethroscopic manipulation (foley placement/cystoscopy) or trauma, and prostatitis. Management of an elevated PSA can include observation or prostate biopsy and we discussed this in detail. Our goal is to detect clinically significant prostate cancers, and manage with either active surveillance, surgery, or radiation for localized disease. Risks of prostate biopsy include bleeding, infection (including life threatening sepsis), pain, and lower urinary symptoms. Hematuria, hematospermia, and blood in the stool are all common after biopsy and can persist up to 4 weeks.   PSA reflex to free today, testosterone, call with results.  If PSA remains elevated he is interested in prostate MRI Trial of Flomax for urinary symptoms Trial of Cialis for ED  Legrand Rams, MD 05/14/2023  [facility] [address], [address] [place], [place] [address] [phone]

## 2023-05-14 NOTE — Patient Instructions (Signed)

## 2023-05-16 LAB — PSA (REFLEX TO FREE) (SERIAL): Prostate Specific Ag, Serum: 4.7 ng/mL — ABNORMAL HIGH (ref 0.0–4.0)

## 2023-05-16 LAB — FPSA% REFLEX
% FREE PSA: 14.3 %
PSA, FREE: 0.67 ng/mL

## 2023-05-17 ENCOUNTER — Telehealth: Payer: Self-pay

## 2023-05-17 DIAGNOSIS — R972 Elevated prostate specific antigen [PSA]: Secondary | ICD-10-CM

## 2023-05-17 NOTE — Telephone Encounter (Signed)
-----   Message from Sondra Come sent at 05/16/2023  4:13 PM EDT ----- PSA has decreased and is stable from prior values from 2019.  I would recommend a repeat PSA with reflex to free in 6 months, and follow-up at that time with [facility]   Legrand Rams, MD 05/16/2023

## 2023-05-17 NOTE — Telephone Encounter (Signed)
Called pt informed him of the information below. PT voiced understanding. Lab ordered. Appt scheduled.

## 2023-06-13 ENCOUNTER — Ambulatory Visit: Payer: Medicare HMO | Admitting: Dermatology

## 2023-06-13 DIAGNOSIS — B078 Other viral warts: Secondary | ICD-10-CM

## 2023-06-13 DIAGNOSIS — L219 Seborrheic dermatitis, unspecified: Secondary | ICD-10-CM | POA: Diagnosis not present

## 2023-06-13 DIAGNOSIS — L720 Epidermal cyst: Secondary | ICD-10-CM | POA: Diagnosis not present

## 2023-06-13 DIAGNOSIS — L729 Follicular cyst of the skin and subcutaneous tissue, unspecified: Secondary | ICD-10-CM

## 2023-06-13 DIAGNOSIS — L738 Other specified follicular disorders: Secondary | ICD-10-CM | POA: Diagnosis not present

## 2023-06-13 DIAGNOSIS — L820 Inflamed seborrheic keratosis: Secondary | ICD-10-CM | POA: Diagnosis not present

## 2023-06-13 DIAGNOSIS — L821 Other seborrheic keratosis: Secondary | ICD-10-CM | POA: Diagnosis not present

## 2023-06-13 DIAGNOSIS — Z79899 Other long term (current) drug therapy: Secondary | ICD-10-CM

## 2023-06-13 DIAGNOSIS — D229 Melanocytic nevi, unspecified: Secondary | ICD-10-CM

## 2023-06-13 DIAGNOSIS — L578 Other skin changes due to chronic exposure to nonionizing radiation: Secondary | ICD-10-CM

## 2023-06-13 DIAGNOSIS — L299 Pruritus, unspecified: Secondary | ICD-10-CM | POA: Diagnosis not present

## 2023-06-13 DIAGNOSIS — W908XXA Exposure to other nonionizing radiation, initial encounter: Secondary | ICD-10-CM

## 2023-06-13 DIAGNOSIS — D2239 Melanocytic nevi of other parts of face: Secondary | ICD-10-CM | POA: Diagnosis not present

## 2023-06-13 DIAGNOSIS — Z7189 Other specified counseling: Secondary | ICD-10-CM

## 2023-06-13 MED ORDER — KETOCONAZOLE 2 % EX SHAM: MEDICATED_SHAMPOO | CUTANEOUS | 5 refills | Status: AC

## 2023-06-13 NOTE — Patient Instructions (Addendum)
Cryotherapy Aftercare  Wash gently with soap and water everyday.   Apply Vaseline and Band-Aid daily until healed.   Restart ketoconazole 2% shampoo 1-3 times weekly, massage into scalp and leave in for 5-10 minutes before rinsing out. May also continue OTC Selsun Blue or Head & Shoulders.  Recommend daily broad spectrum sunscreen SPF 30+ to sun-exposed areas, reapply every 2 hours as needed. Call for new or changing lesions.  Staying in the shade or wearing long sleeves, sun glasses (UVA+UVB protection) and wide brim hats (4-inch brim around the entire circumference of the hat) are also recommended for sun protection.   Due to recent changes in healthcare laws, you may see results of your pathology and/or laboratory studies on MyChart before the doctors have had a chance to review them. We understand that in some cases there may be results that are confusing or concerning to you. Please understand that not all results are received at the same time and often the doctors may need to interpret multiple results in order to provide you with the best plan of care or course of treatment. Therefore, we ask that you please give [place] 2 business days to thoroughly review all your results before contacting the office for clarification. Should we see a critical lab result, you will be contacted sooner.   If You Need Anything After Your Visit  If you have any questions or concerns for your doctor, please call our main line at [phone] and press option 4 to reach your doctor's medical assistant. If no one answers, please leave a voicemail as directed and we will return your call as soon as possible. Messages left after 4 pm will be answered the following business day.   You may also send [place] a message via MyChart. We typically respond to MyChart messages within 1-2 business days.  For prescription refills, please ask your pharmacy to contact our office. Our fax number is [phone].  If you have an urgent  issue when the clinic is closed that cannot wait until the next business day, you can page your doctor at the number below.    Please note that while we do our best to be available for urgent issues outside of office hours, we are not available 24/7.   If you have an urgent issue and are unable to reach [place], you may choose to seek medical care at your doctor's office, retail clinic, urgent care center, or emergency room.  If you have a medical emergency, please immediately call 911 or go to the emergency department.  Pager Numbers  - Dr. Gwen Pounds: [phone]  - Dr. Roseanne Reno: [phone]  - Dr. Katrinka Blazing: [phone]   In the event of inclement weather, please call our main line at [phone] for an update on the status of any delays or closures.  Dermatology Medication Tips: Please keep the boxes that topical medications come in in order to help keep track of the instructions about where and how to use these. Pharmacies typically print the medication instructions only on the boxes and not directly on the medication tubes.   If your medication is too expensive, please contact our office at [phone] option 4 or send [place] a message through MyChart.   We are unable to tell what your co-pay for medications will be in advance as this is different depending on your insurance coverage. However, we may be able to find a substitute medication at lower cost or fill out paperwork to get insurance to cover a needed  medication.   If a prior authorization is required to get your medication covered by your insurance company, please allow [place] 1-2 business days to complete this process.  Drug prices often vary depending on where the prescription is filled and some pharmacies may offer cheaper prices.  The website www.goodrx.com contains coupons for medications through different pharmacies. The prices here do not account for what the cost may be with help from insurance (it may be cheaper with your  insurance), but the website can give you the price if you did not use any insurance.  - You can print the associated coupon and take it with your prescription to the pharmacy.  - You may also stop by our office during regular business hours and pick up a GoodRx coupon card.  - If you need your prescription sent electronically to a different pharmacy, notify our office through [facility] or by phone at [phone] option 4.

## 2023-06-13 NOTE — Progress Notes (Signed)
New Patient Visit   Subjective  William Yang is a 67 y.o. male who presents for the following: spot under right, present for at least 18 months, no change. Also with a few spots at mid forehead, with sweating he also gets similar spots at scalp with itching.   Wart at right 4th finger.   No personal or fhx skin cancer.   The patient has spots, moles and lesions to be evaluated, some may be new or changing and the patient may have concern these could be cancer.   The following portions of the chart were reviewed this encounter and updated as appropriate: medications, allergies, medical history  Review of Systems:  No other skin or systemic complaints except as noted in HPI or Assessment and Plan.  Objective  Well appearing patient in no apparent distress; mood and affect are within normal limits.   A focused examination was performed of the following areas: Face, hands, scalp  Relevant exam findings are noted in the Assessment and Plan.  R infraorbital x 1 Erythematous stuck-on, waxy papule or plaque  Scalp Hx of itch at scalp    Assessment & Plan   ACTINIC DAMAGE - chronic, secondary to cumulative UV radiation exposure/sun exposure over time - diffuse scaly erythematous macules with underlying dyspigmentation - Recommend daily broad spectrum sunscreen SPF 30+ to sun-exposed areas, reapply every 2 hours as needed.  - Recommend staying in the shade or wearing long sleeves, sun glasses (UVA+UVB protection) and wide brim hats (4-inch brim around the entire circumference of the hat). - Call for new or changing lesions.  SEBORRHEIC KERATOSIS - Stuck-on, waxy, tan-brown papules and/or plaques  - Benign-appearing - Discussed benign etiology and prognosis. - Observe - Call for any changes  MELANOCYTIC NEVI Exam: flesh colored papule above left medial brow  Treatment Plan: Benign appearing on exam today. Recommend observation. Call clinic for new or changing moles.  Recommend daily use of broad spectrum spf 30+ sunscreen to sun-exposed areas.   Milia - tiny firm white papules at glabella - type of cyst - benign - sometimes these will clear with nightly OTC adapalene/Differin 0.1% gel or retinol. - may be extracted if symptomatic - observe  Sebaceous Hyperplasia - Small yellow papules with a central dell at glabella - Benign-appearing - Observe. Call for changes.  Inflamed seborrheic keratosis R infraorbital x 1  Symptomatic, irritating, patient would like treated.  Benign-appearing.  Call clinic for new or changing lesions.   Destruction of lesion - R infraorbital x 1 Complexity: simple   Destruction method: cryotherapy   Informed consent: discussed and consent obtained   Timeout:  patient name, date of birth, surgical site, and procedure verified Lesion destroyed using liquid nitrogen: Yes   Region frozen until ice ball extended beyond lesion: Yes   Outcome: patient tolerated procedure well with no complications   Post-procedure details: wound care instructions given    Itch /seborrheic dermatitis Scalp Patient has used ketoconazole 2% shampoo in the past  Restart ketoconazole 2% shampoo 1-3 times weekly, massage into scalp and leave in for 5-10 minutes before rinsing out. May also continue OTC Selsun Blue or Head & Shoulders.   WART Exam: verrucous papule(s) at right 4th finger  Counseling Discussed viral / HPV (Human Papilloma Virus) etiology and risk of spread /infectivity to other areas of body as well as to other people.  Multiple treatments and methods may be required to clear warts and it is possible treatment may not be successful.  Treatment risks include discoloration; scarring and there is still potential for wart recurrence.  Treatment Plan: Destruction Procedure Note Destruction method: cryotherapy   Informed consent: discussed and consent obtained   Lesion destroyed using liquid nitrogen: Yes   Outcome: patient  tolerated procedure well with no complications   Post-procedure details: wound care instructions given   Locations: R 4th finger # of Lesions Treated: 1  Prior to procedure, discussed risks of blister formation, small wound, skin dyspigmentation, or rare scar following cryotherapy. Recommend Vaseline ointment to treated areas while healing.  Return in about 6 months (around 12/12/2023) for Warts, with Dr. Kirtland Bouchard, [facility] follow up.  Anise Salvo, RMA, am acting as scribe for Armida Sans, MD .   Documentation: I have reviewed the above documentation for accuracy and completeness, and I agree with the above.  Armida Sans, MD

## 2023-06-29 ENCOUNTER — Encounter: Payer: Self-pay | Admitting: Dermatology

## 2023-07-31 ENCOUNTER — Encounter: Payer: Self-pay | Admitting: Cardiology

## 2023-07-31 ENCOUNTER — Ambulatory Visit: Payer: Medicare HMO | Attending: Cardiology | Admitting: Cardiology

## 2023-07-31 VITALS — BP 118/76 | HR 66 | Ht 66.0 in | Wt 190.8 lb

## 2023-07-31 DIAGNOSIS — I2510 Atherosclerotic heart disease of native coronary artery without angina pectoris: Secondary | ICD-10-CM | POA: Diagnosis not present

## 2023-07-31 DIAGNOSIS — E7800 Pure hypercholesterolemia, unspecified: Secondary | ICD-10-CM | POA: Diagnosis not present

## 2023-07-31 DIAGNOSIS — F17200 Nicotine dependence, unspecified, uncomplicated: Secondary | ICD-10-CM

## 2023-07-31 DIAGNOSIS — I10 Essential (primary) hypertension: Secondary | ICD-10-CM | POA: Diagnosis not present

## 2023-07-31 MED ORDER — ASPIRIN 81 MG PO TBEC: 81.0000 mg | DELAYED_RELEASE_TABLET | Freq: Every day | ORAL | Status: AC

## 2023-07-31 MED ORDER — ATORVASTATIN CALCIUM 40 MG PO TABS: 40.0000 mg | ORAL_TABLET | Freq: Every day | ORAL | 3 refills | Status: DC

## 2023-07-31 NOTE — Patient Instructions (Signed)
Medication Instructions:   START Aspirin - take one tablet ( 81mg ) by mouth daily.    *If you need a refill on your cardiac medications before your next appointment, please call your pharmacy*   Lab Work:  None Ordered  If you have labs (blood work) drawn today and your tests are completely normal, you will receive your results only by: MyChart Message (if you have MyChart) OR A paper copy in the mail If you have any lab test that is abnormal or we need to change your treatment, we will call you to review the results.   Testing/Procedures:  None Ordered   Follow-Up: At [facility], you and your health needs are our priority.  As part of our continuing mission to provide you with exceptional heart care, we have created designated Provider Care Teams.  These Care Teams include your primary Cardiologist (physician) and Advanced Practice Providers (APPs -  Physician Assistants and Nurse Practitioners) who all work together to provide you with the care you need, when you need it.  We recommend signing up for the patient portal called "MyChart".  Sign up information is provided on this After Visit Summary.  MyChart is used to connect with patients for Virtual Visits (Telemedicine).  Patients are able to view lab/test results, encounter notes, upcoming appointments, etc.  Non-urgent messages can be sent to your provider as well.   To learn more about what you can do with MyChart, go to [email or web address].    Your next appointment:   1 year(s)  Provider:   You may see Debbe Odea, MD or one of the following Advanced Practice Providers on your designated Care Team:   Nicolasa Ducking, NP Eula Listen, PA-C Cadence Fransico Michael, PA-C Charlsie Quest, NP Carlos Levering, NP

## 2023-07-31 NOTE — Progress Notes (Signed)
Cardiology Office Note:    Date:  07/31/2023   ID:  William Yang, DOB 05-30-1956, MRN [id number]  PCP:  Patient, No Pcp Per   [facility] Providers Cardiologist:  Debbe Odea, MD     Referring MD: Glori Luis, MD   Chief Complaint  Patient presents with   Follow-up    Patient denies new or acute cardiac problems/concerns today with last office visit 04/20/21    History of Present Illness:    William Yang is a 67 y.o. male with a hx of CAD (LAD calcifications on chest CT ), hypertension, hyperlipidemia, , current smoker x40+ years who presents for follow-up.  Denies chest pain or shortness of breath, compliant with medications as prescribed.  Not on aspirin for some reason.  He still smokes.  Blood pressure is adequately controlled at home.  Ran out of Lipitor couple of days ago.  Overall feels well, no concerns at this time.  Prior notes Echo 8/22 EF 65%, diastolic function normal. Lexiscan Myoview 8/22, no ischemia. 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   Father had a heart attack in his 60s.  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  Past Medical History:  Diagnosis Date   Chickenpox    Hyperlipidemia    Hypertension     Past Surgical History:  Procedure Laterality Date   APPENDECTOMY  1964    Current Medications: Current Meds  Medication Sig   aspirin EC 81 MG tablet Take 1 tablet (81 mg total) by mouth daily. Swallow whole.   hydrochlorothiazide (HYDRODIURIL) 25 MG tablet Take 1 tablet (25 mg total) by mouth daily.   ketoconazole (NIZORAL) 2 % shampoo Apply one to three times per week, massage into scalp and leave in for 5-10 minutes before rinsing out.   lisinopril (ZESTRIL) 40 MG tablet TAKE 1 TABLET BY MOUTH DAILY   tadalafil (CIALIS) 20 MG tablet Take 1 tablet (20 mg total) by mouth daily as needed (take 45 minutes  prior to sexual activity).   tamsulosin (FLOMAX) 0.4 MG CAPS capsule Take 1 capsule (0.4 mg total) by mouth daily.   [DISCONTINUED] atorvastatin (LIPITOR) 40 MG tablet Take 1 tablet (40 mg total) by mouth daily.     Allergies:   Patient has no known allergies.   Social History   Socioeconomic History   Marital status: Widowed    Spouse name: Not on file   Number of children: 2   Years of education: Not on file   Highest education level: Not on file  Occupational History   Occupation: Retired  Tobacco Use   Smoking status: Former    Current packs/day: 0.00    Average packs/day: 1 pack/day for 45.0 years (45.0 ttl pk-yrs)    Types: Cigarettes    Start date: 12/25/1977    Quit date: 12/26/2022    Years since quitting: 0.5   Smokeless tobacco: Never  Vaping Use   Vaping status: Never Used  Substance and Sexual Activity   Alcohol use: Yes    Alcohol/week: 21.0 standard drinks of alcohol    Types: 21 Standard drinks or equivalent per week    Comment: 21 beers a week    Drug use: No   Sexual activity: Not on file  Other Topics Concern   Not on file  Social History Narrative   Not on file   Social Determinants of Health   Financial  Resource Strain: Low Risk  (01/03/2023)   Overall Financial Resource Strain (CARDIA)    Difficulty of Paying Living Expenses: Not hard at all  Food Insecurity: No Food Insecurity (01/03/2023)   Hunger Vital Sign    Worried About Running Out of Food in the Last Year: Never true    Ran Out of Food in the Last Year: Never true  Transportation Needs: No Transportation Needs (01/03/2023)   PRAPARE - Administrator, Civil Service (Medical): No    Lack of Transportation (Non-Medical): No  Physical Activity: Inactive (01/03/2023)   Exercise Vital Sign    Days of Exercise per Week: 0 days    Minutes of Exercise per Session: 0 min  Stress: No Stress Concern Present (01/03/2023)   [employer] of Occupational Health - Occupational Stress  Questionnaire    Feeling of Stress : Not at all  Social Connections: Moderately Integrated (01/03/2023)   Social Connection and Isolation Panel [NHANES]    Frequency of Communication with Friends and Family: More than three times a week    Frequency of Social Gatherings with Friends and Family: Twice a week    Attends Religious Services: More than 4 times per year    Active Member of [employer] or Organizations: Yes    Attends Banker Meetings: 1 to 4 times per year    Marital Status: Widowed     Family History: The patient's family history includes Alcoholism in an other family member; Hypertension in an other family member; Lung cancer in his brother.  ROS:   Please see the history of present illness.     All other systems reviewed and are negative.  EKGs/Labs/Other Studies Reviewed:    The following studies were reviewed today:  EKG Interpretation Date/Time:  Wednesday July 31 2023 11:29:36 EST Ventricular Rate:  66 PR Interval:  194 QRS Duration:  98 QT Interval:  378 QTC Calculation: 396 R Axis:   80  Text Interpretation: Normal sinus rhythm Normal ECG Confirmed by Debbe Odea ([id number]) on 07/31/2023 11:44:39 AM     Recent Labs: 04/11/2023: ALT 21; BUN 12; Creatinine, Ser 0.96; Potassium 4.1; Sodium 137  Recent Lipid Panel    Component Value Date/Time   CHOL 158 04/11/2023 1028   TRIG 154.0 (H) 04/11/2023 1028   HDL 45.20 04/11/2023 1028   CHOLHDL 4 04/11/2023 1028   VLDL 30.8 04/11/2023 1028   LDLCALC 82 04/11/2023 1028   LDLDIRECT 94.0 06/20/2020 1056     Risk Assessment/Calculations:          Physical Exam:    VS:  BP 118/76 (BP Location: Left Arm, Patient Position: Sitting, Cuff Size: Large)   Pulse 66   Ht 5\' 6"  (1.676 m)   Wt 190 lb 12.8 oz (86.5 kg)   SpO2 98%   BMI 30.80 kg/m     Wt Readings from Last 3 Encounters:  07/31/23 190 lb 12.8 oz (86.5 kg)  05/14/23 190 lb (86.2 kg)  04/05/23 185 lb (83.9 kg)     GEN:  Well  nourished, well developed in no acute distress HEENT: Normal NECK: No JVD; No carotid bruits CARDIAC: RRR, no murmurs, rubs, gallops RESPIRATORY:  Clear to auscultation without rales, wheezing or rhonchi  ABDOMEN: Soft, non-tender, non-distended MUSCULOSKELETAL:  No edema; No deformity  SKIN: Warm and dry NEUROLOGIC:  Alert and oriented x 3 PSYCHIATRIC:  Normal affect   ASSESSMENT:    1. Coronary artery disease involving native coronary artery of native heart,  unspecified whether angina present   2. Primary hypertension   3. Pure hypercholesterolemia   4. Smoking    PLAN:    In order of problems listed above:  CAD, coronary calcifications in LAD.  Denies chest pain.  Advised to restart aspirin 81 mg daily, continue  Lipitor 40 mg daily.  EF normal at 65%, last Myoview with no ischemia. Hypertension, BP controlled.  Continue lisinopril 40 mg daily, HCTZ 25 mg daily. Hyperlipidemia, Lipitor 40 mg daily Current smoker, cessation again advised.  Follow-up in 1 year.   Medication Adjustments/Labs and Tests Ordered: Current medicines are reviewed at length with the patient today.  Concerns regarding medicines are outlined above.  Orders Placed This Encounter  Procedures   EKG 12-Lead     Meds ordered this encounter  Medications   atorvastatin (LIPITOR) 40 MG tablet    Sig: Take 1 tablet (40 mg total) by mouth daily.    Dispense:  30 tablet    Refill:  3   aspirin EC 81 MG tablet    Sig: Take 1 tablet (81 mg total) by mouth daily. Swallow whole.      Patient Instructions  Medication Instructions:   START Aspirin - take one tablet ( 81mg ) by mouth daily.    *If you need a refill on your cardiac medications before your next appointment, please call your pharmacy*   Lab Work:  None Ordered  If you have labs (blood work) drawn today and your tests are completely normal, you will receive your results only by: MyChart Message (if you have MyChart) OR A paper copy  in the mail If you have any lab test that is abnormal or we need to change your treatment, we will call you to review the results.   Testing/Procedures:  None Ordered   Follow-Up: At [facility], you and your health needs are our priority.  As part of our continuing mission to provide you with exceptional heart care, we have created designated Provider Care Teams.  These Care Teams include your primary Cardiologist (physician) and Advanced Practice Providers (APPs -  Physician Assistants and Nurse Practitioners) who all work together to provide you with the care you need, when you need it.  We recommend signing up for the patient portal called "MyChart".  Sign up information is provided on this After Visit Summary.  MyChart is used to connect with patients for Virtual Visits (Telemedicine).  Patients are able to view lab/test results, encounter notes, upcoming appointments, etc.  Non-urgent messages can be sent to your provider as well.   To learn more about what you can do with MyChart, go to [email or web address].    Your next appointment:   1 year(s)  Provider:   You may see Debbe Odea, MD or one of the following Advanced Practice Providers on your designated Care Team:   Nicolasa Ducking, NP Eula Listen, PA-C Cadence Fransico Michael, PA-C Charlsie Quest, NP Carlos Levering, NP   Signed, Debbe Odea, MD  07/31/2023 12:29 PM    [facility]

## 2023-08-08 DIAGNOSIS — J432 Centrilobular emphysema: Secondary | ICD-10-CM | POA: Diagnosis not present

## 2023-08-08 DIAGNOSIS — I2510 Atherosclerotic heart disease of native coronary artery without angina pectoris: Secondary | ICD-10-CM | POA: Diagnosis not present

## 2023-08-08 DIAGNOSIS — I10 Essential (primary) hypertension: Secondary | ICD-10-CM | POA: Diagnosis not present

## 2023-08-08 DIAGNOSIS — Z6830 Body mass index (BMI) 30.0-30.9, adult: Secondary | ICD-10-CM | POA: Diagnosis not present

## 2023-08-18 ENCOUNTER — Other Ambulatory Visit: Payer: Self-pay | Admitting: Family Medicine

## 2023-08-20 DIAGNOSIS — H524 Presbyopia: Secondary | ICD-10-CM | POA: Diagnosis not present

## 2023-09-13 ENCOUNTER — Other Ambulatory Visit: Payer: Self-pay | Admitting: Family Medicine

## 2023-09-13 DIAGNOSIS — I10 Essential (primary) hypertension: Secondary | ICD-10-CM

## 2023-10-07 ENCOUNTER — Ambulatory Visit: Payer: Medicare HMO | Admitting: Family Medicine

## 2023-10-08 ENCOUNTER — Encounter: Payer: Medicare HMO | Admitting: Nurse Practitioner

## 2023-10-25 ENCOUNTER — Other Ambulatory Visit: Payer: Self-pay

## 2023-10-25 MED ORDER — ATORVASTATIN CALCIUM 40 MG PO TABS: 40.0000 mg | ORAL_TABLET | Freq: Every day | ORAL | 3 refills | Status: DC

## 2023-11-07 ENCOUNTER — Ambulatory Visit
Admission: RE | Admit: 2023-11-07 | Discharge: 2023-11-07 | Disposition: A | Discharge status: Home / Self Care | Payer: Medicare HMO | Source: Ambulatory Visit | Attending: Internal Medicine | Admitting: Internal Medicine

## 2023-11-07 DIAGNOSIS — F17210 Nicotine dependence, cigarettes, uncomplicated: Secondary | ICD-10-CM | POA: Insufficient documentation

## 2023-11-07 DIAGNOSIS — Z87891 Personal history of nicotine dependence: Secondary | ICD-10-CM | POA: Insufficient documentation

## 2023-11-07 DIAGNOSIS — Z122 Encounter for screening for malignant neoplasm of respiratory organs: Secondary | ICD-10-CM | POA: Diagnosis not present

## 2023-11-13 ENCOUNTER — Ambulatory Visit: Payer: Medicare HMO | Admitting: Urology

## 2023-11-13 VITALS — BP 126/83 | HR 87 | Ht 66.0 in | Wt 187.0 lb

## 2023-11-13 DIAGNOSIS — R399 Unspecified symptoms and signs involving the genitourinary system: Secondary | ICD-10-CM | POA: Diagnosis not present

## 2023-11-13 DIAGNOSIS — N529 Male erectile dysfunction, unspecified: Secondary | ICD-10-CM

## 2023-11-13 DIAGNOSIS — R972 Elevated prostate specific antigen [PSA]: Secondary | ICD-10-CM | POA: Diagnosis not present

## 2023-11-13 LAB — BLADDER SCAN AMB NON-IMAGING

## 2023-11-13 MED ORDER — TADALAFIL 20 MG PO TABS: 20.0000 mg | ORAL_TABLET | Freq: Every day | ORAL | 6 refills | Status: DC | PRN

## 2023-11-13 MED ORDER — TAMSULOSIN HCL 0.4 MG PO CAPS: 0.4000 mg | ORAL_CAPSULE | Freq: Every day | ORAL | 11 refills | Status: DC

## 2023-11-13 NOTE — Progress Notes (Signed)
   11/13/2023 11:13 AM   William Yang 1956/05/07 [id number]  Reason for visit: Follow up elevated PSA, urinary symptoms, ED  HPI: 68 year old male who I originally met in September 2024 for the above issues.  He has a long history of variable and mildly elevated PSA ranging to 4.4 back in January 2019, 4.46 November 2021, 3.35 in December 2021, and 5.22 in August 2024.  He opted for a repeat PSA which was stable at 4.7(14% free) and he deferred MRI or biopsy.  He was also having some postvoid dribbling that was bothersome and ED.  At our visit in September 2024 we started Flomax for the urinary symptoms and Cialis 20 mg on demand for ED.  He has had excellent results on those medications-denies any urinary symptoms on the Flomax currently and PVR today is normal at 38ml.  Cialis 20 mg on demand working well for ED.  I had recommended a testosterone as well but this was never checked.  In terms of his chronically elevated mildly elevated PSA, reassurance was provided regarding the stability since 2019.  I again offered prostate MRI for further evaluation but he deferred.  Will obtain a PHI score at follow-up next year for further restratification.  Continue Cialis and Flomax RTC 1 year PHI score prior for chronic, stable, mildly elevated PSA  Sondra Come, MD  [facility] [address], [address] [place], [place] [address] [phone]

## 2023-12-09 ENCOUNTER — Other Ambulatory Visit: Payer: Self-pay

## 2023-12-09 DIAGNOSIS — Z122 Encounter for screening for malignant neoplasm of respiratory organs: Secondary | ICD-10-CM

## 2023-12-09 DIAGNOSIS — F17210 Nicotine dependence, cigarettes, uncomplicated: Secondary | ICD-10-CM

## 2023-12-09 DIAGNOSIS — Z87891 Personal history of nicotine dependence: Secondary | ICD-10-CM

## 2023-12-25 ENCOUNTER — Ambulatory Visit: Payer: Medicare HMO | Admitting: Dermatology

## 2024-01-03 ENCOUNTER — Encounter: Payer: Medicare HMO | Admitting: Family Medicine

## 2024-01-23 ENCOUNTER — Other Ambulatory Visit: Payer: Self-pay | Admitting: *Deleted

## 2024-01-23 MED ORDER — ATORVASTATIN CALCIUM 40 MG PO TABS: 40.0000 mg | ORAL_TABLET | Freq: Every day | ORAL | 6 refills | Status: DC

## 2024-01-24 ENCOUNTER — Other Ambulatory Visit: Payer: Self-pay

## 2024-01-24 MED ORDER — ATORVASTATIN CALCIUM 40 MG PO TABS: 40.0000 mg | ORAL_TABLET | Freq: Every day | ORAL | 6 refills | Status: DC

## 2024-02-06 DIAGNOSIS — H9193 Unspecified hearing loss, bilateral: Secondary | ICD-10-CM | POA: Diagnosis not present

## 2024-02-06 DIAGNOSIS — I2510 Atherosclerotic heart disease of native coronary artery without angina pectoris: Secondary | ICD-10-CM | POA: Diagnosis not present

## 2024-02-06 DIAGNOSIS — Z125 Encounter for screening for malignant neoplasm of prostate: Secondary | ICD-10-CM | POA: Diagnosis not present

## 2024-02-06 DIAGNOSIS — Z6829 Body mass index (BMI) 29.0-29.9, adult: Secondary | ICD-10-CM | POA: Diagnosis not present

## 2024-02-06 DIAGNOSIS — Z1331 Encounter for screening for depression: Secondary | ICD-10-CM | POA: Diagnosis not present

## 2024-02-06 DIAGNOSIS — J432 Centrilobular emphysema: Secondary | ICD-10-CM | POA: Diagnosis not present

## 2024-02-06 DIAGNOSIS — Z0000 Encounter for general adult medical examination without abnormal findings: Secondary | ICD-10-CM | POA: Diagnosis not present

## 2024-02-25 ENCOUNTER — Ambulatory Visit: Admitting: Urology

## 2024-02-25 VITALS — BP 110/72 | HR 77 | Ht 66.0 in | Wt 185.0 lb

## 2024-02-25 DIAGNOSIS — R972 Elevated prostate specific antigen [PSA]: Secondary | ICD-10-CM

## 2024-02-25 MED ORDER — TADALAFIL 20 MG PO TABS: 20.0000 mg | ORAL_TABLET | Freq: Every day | ORAL | 6 refills | Status: AC | PRN

## 2024-02-25 MED ORDER — TAMSULOSIN HCL 0.4 MG PO CAPS: 0.4000 mg | ORAL_CAPSULE | Freq: Every day | ORAL | 11 refills | Status: AC

## 2024-02-25 NOTE — Progress Notes (Signed)
   02/25/2024 1:57 PM   [name] Nov 12, 1955 [id number]  Reason for visit: Follow up elevated PSA, urinary symptoms, ED  HPI: 68 year old male I followed since September 2024 for a long history of variable and mildly elevated PSA.  PSA was 4.4 in January 2019, 4.24 June 2020, 3.23 July 2020, 5.22 March 2023, and 4.7(14% free) in September 2024.  Based on the stability over the last 5 years he opted to defer prostate MRI or biopsy.  He has a new PCP, and they checked a PSA which was stable at 5.1, they placed a referral to urology for elevated PSA.  Reassurance was provided that PSA has been essentially stable over the last 6 years and monitoring alone is very reasonable.  I again offered MRI or biopsy and he deferred which I think is reasonable.  In terms of his urinary symptoms, well-controlled on Flomax .  Using Cialis  20 mg on demand for ED with good results.  Occasionally has some retrograde ejaculation with the Flomax  that is not bothersome.  Keep follow-up as previously scheduled March 2026 for PHI score   William JAYSON Burnet, MD  [facility] [address] [place], [place] [address] [phone]

## 2024-03-12 ENCOUNTER — Telehealth: Payer: Self-pay

## 2024-03-12 NOTE — Telephone Encounter (Signed)
 I called patient to let him know that we received a medication refill request from his pharmacy.  I asked patient if he is planning to continue with us  for primary care, as Dr. Camellia Her is no longer with our practice.  Patient states he has a new PCP, Odis Greener, MD, at [facility] in [place], which is where he lives.  I let patient know that I will note that in our system.  Patient states he will reach out to his pharmacy and ask them to send the medication refill request to his new PCP.

## 2024-05-19 ENCOUNTER — Encounter: Payer: Self-pay | Admitting: Urology

## 2024-07-19 ENCOUNTER — Other Ambulatory Visit: Payer: Self-pay | Admitting: Cardiology

## 2024-07-19 DIAGNOSIS — E7800 Pure hypercholesterolemia, unspecified: Secondary | ICD-10-CM

## 2024-07-19 DIAGNOSIS — I2510 Atherosclerotic heart disease of native coronary artery without angina pectoris: Secondary | ICD-10-CM

## 2024-07-19 DIAGNOSIS — I10 Essential (primary) hypertension: Secondary | ICD-10-CM

## 2024-08-03 ENCOUNTER — Ambulatory Visit: Attending: Student | Admitting: Student

## 2024-08-03 ENCOUNTER — Encounter: Payer: Self-pay | Admitting: Student

## 2024-08-03 VITALS — BP 128/76 | HR 69 | Ht 66.0 in | Wt 203.6 lb

## 2024-08-03 DIAGNOSIS — I2510 Atherosclerotic heart disease of native coronary artery without angina pectoris: Secondary | ICD-10-CM

## 2024-08-03 DIAGNOSIS — I10 Essential (primary) hypertension: Secondary | ICD-10-CM | POA: Diagnosis not present

## 2024-08-03 DIAGNOSIS — E782 Mixed hyperlipidemia: Secondary | ICD-10-CM | POA: Diagnosis not present

## 2024-08-03 DIAGNOSIS — Z720 Tobacco use: Secondary | ICD-10-CM | POA: Diagnosis not present

## 2024-08-03 NOTE — Patient Instructions (Signed)
 Medication Instructions:   Your physician recommends that you continue on your current medications as directed. Please refer to the Current Medication list given to you today.    *If you need a refill on your cardiac medications before your next appointment, please call your pharmacy*  Lab Work:  None ordered at this time   If you have labs (blood work) drawn today and your tests are completely normal, you will receive your results only by:  MyChart Message (if you have MyChart) OR  A paper copy in the mail If you have any lab test that is abnormal or we need to change your treatment, we will call you to review the results.  Testing/Procedures:  None ordered at this time   Referrals:  None ordered at this time   Follow-Up:  At [facility], you and your health needs are our priority.  As part of our continuing mission to provide you with exceptional heart care, our providers are all part of one team.  This team includes your primary Cardiologist (physician) and Advanced Practice Providers or APPs (Physician Assistants and Nurse Practitioners) who all work together to provide you with the care you need, when you need it.  Your next appointment:   1 year(s)  Provider:    Redell Cave, MD or Barnie Hila, NP    We recommend signing up for the patient portal called MyChart.  Sign up information is provided on this After Visit Summary.  MyChart is used to connect with patients for Virtual Visits (Telemedicine).  Patients are able to view lab/test results, encounter notes, upcoming appointments, etc.  Non-urgent messages can be sent to your provider as well.   To learn more about what you can do with MyChart, go to [email or web address].

## 2024-08-03 NOTE — Progress Notes (Signed)
 Cardiology Clinic Note   Date: 08/03/2024 ID: William Yang, DOB 1955-10-03, MRN [id number]  Primary Cardiologist:  Redell Cave, MD  Chief Complaint   William Yang is a 68 y.o. male who presents to the clinic today for routine follow up.   Patient Profile   William Yang is followed by Dr. Cave for the history outlined below.      Past medical history significant for: Coronary artery calcification/DOE. Nuclear stress test 04/19/2021: Normal, low risk study. Echo 04/19/2021: EF 65 to 70%.  No RWMA.  Normal diastolic parameters.  Normal RV size/function.  No significant valvular abnormalities. CT chest 11/07/2023: Atherosclerotic calcification of the aorta with involvement of the LAD.  Heart is at the upper limits of normal size. Hypertension. Hyperlipidemia. Lipid panel 04/11/2023: LDL 82, HDL 45, TG 154, total 158. Emphysema. Tobacco abuse.  In summary, patient was first evaluated by Dr. Cave on 03/02/2021 for DOE.  Patient reported 30-month history of shortness of breath with exertion.  He noticed symptoms when moving furniture and going up stairs.  He denied chest pain.  He reported asbestos exposure for about 10 years when he was in his 38s.  He underwent nuclear stress test which was a normal low risk study.  Echo demonstrated normal LV/RV function.  Upon follow-up in September he reported overall improvement of DOE.  Patient was last seen in the office by Dr. Cave on 07/31/2023 for routine follow-up.  He was doing well at that time.  He was forted he was not taking aspirin  and was instructed to restart it.     History of Present Illness    Today, patient is doing well. Patient denies shortness of breath, dyspnea on exertion, lower extremity edema, orthopnea or PND. No chest pain, pressure, or tightness. No palpitations. He does not participate in routine exercise. He stays active during the warmer months doing yard work. He is independent with ADLs and able to  perform light to moderate household chores without limitation. He is working on quitting smoking.     ROS: All other systems reviewed and are otherwise negative except as noted in History of Present Illness.  EKGs/Labs Reviewed    EKG Interpretation Date/Time:  Monday August 03 2024 13:31:36 EST Ventricular Rate:  69 PR Interval:  202 QRS Duration:  96 QT Interval:  382 QTC Calculation: 409 R Axis:   73  Text Interpretation: Normal sinus rhythm When compared with ECG of 31-Jul-2023 11:29, No significant change was found Confirmed by Loistine Sober [phone]) on 08/03/2024 1:37:17 PM   Physical Exam    VS:  BP 128/76 (BP Location: Left Arm, Patient Position: Sitting, Cuff Size: Normal)   Pulse 69   Ht 5' 6 (1.676 m)   Wt 203 lb 9.6 oz (92.4 kg)   SpO2 98%   BMI 32.86 kg/m  , BMI Body mass index is 32.86 kg/m.  GEN: Well nourished, well developed, in no acute distress. Neck: No JVD or carotid bruits. Cardiac:  RRR.  No murmur. No rubs or gallops.   Respiratory:  Respirations regular and unlabored. Clear to auscultation without rales, wheezing or rhonchi. GI: Soft, nontender, nondistended. Extremities: Radials/DP/PT 2+ and equal bilaterally. No clubbing or cyanosis. No edema  Skin: Warm and dry, no rash. Neuro: Strength intact.  Assessment & Plan   Coronary artery calcification CT chest March 2025 demonstrated atherosclerotic calcification of the aorta with involvement of the LAD.  Patient denies chest pain, pressure or tightness. No dyspnea. He does not participate  in routine exercise but is active around his home during the warmer months.  - Continue aspirin , Lipitor.  Hypertension BP today 138/76 on intake and 128/76 on my recheck. No report of headaches or dizziness.  - Continue lisinopril . Hydrochlorothiazide .  Hyperlipidemia LDL 82 August 2024. - Continue atorvastatin . - Upcoming visit with PCP at the end of this week and will get labs at that time.    Tobacco abuse Patient is working on quitting smoking with the help of Chantix. He reports since September he has mostly stopped smoking. Every once in a while he will have 2-3 cigarettes in a day then go days and days without any. He is interested in stopping completely and is considering trying nicotine gum.  - Encouraged complete cessation.   Disposition: Return in 1 year or sooner as needed.          Signed, Barnie HERO. Evanell Redlich, DNP, NP-C

## 2024-08-06 ENCOUNTER — Encounter: Payer: Self-pay | Admitting: Urology

## 2024-08-07 ENCOUNTER — Ambulatory Visit: Admitting: Nurse Practitioner

## 2024-08-07 DIAGNOSIS — I2510 Atherosclerotic heart disease of native coronary artery without angina pectoris: Secondary | ICD-10-CM | POA: Diagnosis not present

## 2024-08-07 DIAGNOSIS — Z6831 Body mass index (BMI) 31.0-31.9, adult: Secondary | ICD-10-CM | POA: Diagnosis not present

## 2024-08-07 DIAGNOSIS — J432 Centrilobular emphysema: Secondary | ICD-10-CM | POA: Diagnosis not present

## 2024-09-17 ENCOUNTER — Other Ambulatory Visit: Payer: Self-pay | Admitting: Cardiology

## 2024-09-17 DIAGNOSIS — I10 Essential (primary) hypertension: Secondary | ICD-10-CM

## 2024-09-17 DIAGNOSIS — I2510 Atherosclerotic heart disease of native coronary artery without angina pectoris: Secondary | ICD-10-CM

## 2024-09-17 DIAGNOSIS — E7800 Pure hypercholesterolemia, unspecified: Secondary | ICD-10-CM

## 2024-11-05 ENCOUNTER — Other Ambulatory Visit

## 2024-11-11 ENCOUNTER — Ambulatory Visit: Admitting: Urology

## 2024-11-12 ENCOUNTER — Ambulatory Visit: Admitting: Urology

## 2024-11-17 ENCOUNTER — Ambulatory Visit: Admitting: Urology
